# Patient Record
Sex: Female | Born: 1950
Health system: Southern US, Community
[De-identification: ages and names within clinical notes are randomized; demographics above are authoritative.]

## PROBLEM LIST (undated history)

## (undated) DIAGNOSIS — I10 Essential (primary) hypertension: Secondary | ICD-10-CM

## (undated) DIAGNOSIS — J309 Allergic rhinitis, unspecified: Secondary | ICD-10-CM

## (undated) DIAGNOSIS — E785 Hyperlipidemia, unspecified: Secondary | ICD-10-CM

## (undated) DIAGNOSIS — R3129 Other microscopic hematuria: Secondary | ICD-10-CM

## (undated) DIAGNOSIS — L02416 Cutaneous abscess of left lower limb: Secondary | ICD-10-CM

## (undated) DIAGNOSIS — M1990 Unspecified osteoarthritis, unspecified site: Secondary | ICD-10-CM

## (undated) HISTORY — PX: OTHER SURGICAL HISTORY: SHX169

## (undated) HISTORY — DX: Cutaneous abscess of left lower limb: L02.416

## (undated) HISTORY — PX: BLADDER ASPIRATION: SHX472

---

## 2004-05-08 ENCOUNTER — Ambulatory Visit: Payer: Self-pay

## 2005-05-23 ENCOUNTER — Ambulatory Visit: Payer: Self-pay

## 2005-06-04 ENCOUNTER — Ambulatory Visit: Payer: Self-pay

## 2005-08-27 ENCOUNTER — Ambulatory Visit: Payer: Self-pay | Admitting: Gastroenterology

## 2005-10-31 ENCOUNTER — Ambulatory Visit: Payer: Self-pay | Admitting: Internal Medicine

## 2005-12-18 ENCOUNTER — Ambulatory Visit: Payer: Self-pay | Admitting: Urology

## 2006-05-27 ENCOUNTER — Ambulatory Visit: Payer: Self-pay

## 2007-06-10 ENCOUNTER — Ambulatory Visit: Payer: Self-pay

## 2008-06-17 ENCOUNTER — Ambulatory Visit: Payer: Self-pay

## 2009-07-20 ENCOUNTER — Ambulatory Visit: Payer: Self-pay | Admitting: Internal Medicine

## 2010-11-13 ENCOUNTER — Ambulatory Visit: Payer: Self-pay | Admitting: Internal Medicine

## 2010-12-01 ENCOUNTER — Emergency Department: Payer: Self-pay | Admitting: Internal Medicine

## 2011-10-21 ENCOUNTER — Ambulatory Visit: Payer: Self-pay | Admitting: Gastroenterology

## 2012-01-06 ENCOUNTER — Ambulatory Visit: Payer: Self-pay | Admitting: Internal Medicine

## 2013-01-07 ENCOUNTER — Ambulatory Visit: Payer: Self-pay | Admitting: Internal Medicine

## 2014-01-14 ENCOUNTER — Ambulatory Visit: Payer: Self-pay | Admitting: Internal Medicine

## 2015-01-16 ENCOUNTER — Other Ambulatory Visit: Payer: Self-pay | Admitting: Internal Medicine

## 2015-01-16 DIAGNOSIS — Z1231 Encounter for screening mammogram for malignant neoplasm of breast: Secondary | ICD-10-CM

## 2015-01-20 ENCOUNTER — Ambulatory Visit
Admission: RE | Admit: 2015-01-20 | Discharge: 2015-01-20 | Disposition: A | Discharge status: Home / Self Care | Payer: No Typology Code available for payment source | Source: Ambulatory Visit | Attending: Internal Medicine | Admitting: Internal Medicine

## 2015-01-20 DIAGNOSIS — Z1231 Encounter for screening mammogram for malignant neoplasm of breast: Secondary | ICD-10-CM | POA: Diagnosis not present

## 2015-08-02 DIAGNOSIS — N183 Chronic kidney disease, stage 3 unspecified: Secondary | ICD-10-CM | POA: Insufficient documentation

## 2015-10-19 DIAGNOSIS — M810 Age-related osteoporosis without current pathological fracture: Secondary | ICD-10-CM | POA: Diagnosis not present

## 2015-11-16 DIAGNOSIS — D225 Melanocytic nevi of trunk: Secondary | ICD-10-CM | POA: Diagnosis not present

## 2015-11-16 DIAGNOSIS — C44712 Basal cell carcinoma of skin of right lower limb, including hip: Secondary | ICD-10-CM | POA: Diagnosis not present

## 2015-11-16 DIAGNOSIS — D2261 Melanocytic nevi of right upper limb, including shoulder: Secondary | ICD-10-CM | POA: Diagnosis not present

## 2015-11-16 DIAGNOSIS — L570 Actinic keratosis: Secondary | ICD-10-CM | POA: Diagnosis not present

## 2015-11-16 DIAGNOSIS — X32XXXA Exposure to sunlight, initial encounter: Secondary | ICD-10-CM | POA: Diagnosis not present

## 2015-11-16 DIAGNOSIS — D2272 Melanocytic nevi of left lower limb, including hip: Secondary | ICD-10-CM | POA: Diagnosis not present

## 2015-11-16 DIAGNOSIS — Z85828 Personal history of other malignant neoplasm of skin: Secondary | ICD-10-CM | POA: Diagnosis not present

## 2015-11-16 DIAGNOSIS — D485 Neoplasm of uncertain behavior of skin: Secondary | ICD-10-CM | POA: Diagnosis not present

## 2015-12-14 ENCOUNTER — Other Ambulatory Visit: Payer: Self-pay | Admitting: Internal Medicine

## 2015-12-14 DIAGNOSIS — Z1231 Encounter for screening mammogram for malignant neoplasm of breast: Secondary | ICD-10-CM

## 2016-01-12 DIAGNOSIS — C44712 Basal cell carcinoma of skin of right lower limb, including hip: Secondary | ICD-10-CM | POA: Diagnosis not present

## 2016-01-25 ENCOUNTER — Ambulatory Visit
Admission: RE | Admit: 2016-01-25 | Discharge: 2016-01-25 | Disposition: A | Discharge status: Home / Self Care | Payer: PPO | Source: Ambulatory Visit | Attending: Internal Medicine | Admitting: Internal Medicine

## 2016-01-25 ENCOUNTER — Other Ambulatory Visit: Payer: Self-pay | Admitting: Internal Medicine

## 2016-01-25 DIAGNOSIS — Z1231 Encounter for screening mammogram for malignant neoplasm of breast: Secondary | ICD-10-CM | POA: Diagnosis not present

## 2016-01-25 DIAGNOSIS — I10 Essential (primary) hypertension: Secondary | ICD-10-CM | POA: Diagnosis not present

## 2016-01-25 DIAGNOSIS — R3129 Other microscopic hematuria: Secondary | ICD-10-CM | POA: Diagnosis not present

## 2016-01-25 DIAGNOSIS — M810 Age-related osteoporosis without current pathological fracture: Secondary | ICD-10-CM | POA: Diagnosis not present

## 2016-02-01 DIAGNOSIS — N183 Chronic kidney disease, stage 3 (moderate): Secondary | ICD-10-CM | POA: Diagnosis not present

## 2016-02-01 DIAGNOSIS — M810 Age-related osteoporosis without current pathological fracture: Secondary | ICD-10-CM | POA: Diagnosis not present

## 2016-02-01 DIAGNOSIS — I10 Essential (primary) hypertension: Secondary | ICD-10-CM | POA: Diagnosis not present

## 2016-02-01 DIAGNOSIS — R3129 Other microscopic hematuria: Secondary | ICD-10-CM | POA: Diagnosis not present

## 2016-02-01 DIAGNOSIS — E7800 Pure hypercholesterolemia, unspecified: Secondary | ICD-10-CM | POA: Diagnosis not present

## 2016-05-14 DIAGNOSIS — H25813 Combined forms of age-related cataract, bilateral: Secondary | ICD-10-CM | POA: Diagnosis not present

## 2016-05-16 DIAGNOSIS — D2261 Melanocytic nevi of right upper limb, including shoulder: Secondary | ICD-10-CM | POA: Diagnosis not present

## 2016-05-16 DIAGNOSIS — Z85828 Personal history of other malignant neoplasm of skin: Secondary | ICD-10-CM | POA: Diagnosis not present

## 2016-05-16 DIAGNOSIS — D3612 Benign neoplasm of peripheral nerves and autonomic nervous system, upper limb, including shoulder: Secondary | ICD-10-CM | POA: Diagnosis not present

## 2016-05-16 DIAGNOSIS — D485 Neoplasm of uncertain behavior of skin: Secondary | ICD-10-CM | POA: Diagnosis not present

## 2016-05-16 DIAGNOSIS — C44719 Basal cell carcinoma of skin of left lower limb, including hip: Secondary | ICD-10-CM | POA: Diagnosis not present

## 2016-05-16 DIAGNOSIS — D2271 Melanocytic nevi of right lower limb, including hip: Secondary | ICD-10-CM | POA: Diagnosis not present

## 2016-06-14 DIAGNOSIS — C44719 Basal cell carcinoma of skin of left lower limb, including hip: Secondary | ICD-10-CM | POA: Diagnosis not present

## 2016-07-26 DIAGNOSIS — N183 Chronic kidney disease, stage 3 (moderate): Secondary | ICD-10-CM | POA: Diagnosis not present

## 2016-07-26 DIAGNOSIS — R3129 Other microscopic hematuria: Secondary | ICD-10-CM | POA: Diagnosis not present

## 2016-07-26 DIAGNOSIS — I10 Essential (primary) hypertension: Secondary | ICD-10-CM | POA: Diagnosis not present

## 2016-08-02 DIAGNOSIS — Z0000 Encounter for general adult medical examination without abnormal findings: Secondary | ICD-10-CM | POA: Diagnosis not present

## 2016-08-02 DIAGNOSIS — Z1211 Encounter for screening for malignant neoplasm of colon: Secondary | ICD-10-CM | POA: Diagnosis not present

## 2016-08-02 DIAGNOSIS — I10 Essential (primary) hypertension: Secondary | ICD-10-CM | POA: Diagnosis not present

## 2016-08-02 DIAGNOSIS — D751 Secondary polycythemia: Secondary | ICD-10-CM | POA: Diagnosis not present

## 2016-08-02 DIAGNOSIS — E7800 Pure hypercholesterolemia, unspecified: Secondary | ICD-10-CM | POA: Diagnosis not present

## 2016-08-02 DIAGNOSIS — N183 Chronic kidney disease, stage 3 (moderate): Secondary | ICD-10-CM | POA: Diagnosis not present

## 2016-08-02 DIAGNOSIS — R3129 Other microscopic hematuria: Secondary | ICD-10-CM | POA: Diagnosis not present

## 2016-08-02 DIAGNOSIS — M810 Age-related osteoporosis without current pathological fracture: Secondary | ICD-10-CM | POA: Diagnosis not present

## 2016-08-02 DIAGNOSIS — Z1212 Encounter for screening for malignant neoplasm of rectum: Secondary | ICD-10-CM | POA: Diagnosis not present

## 2016-09-12 DIAGNOSIS — Z86010 Personal history of colonic polyps: Secondary | ICD-10-CM | POA: Diagnosis not present

## 2016-12-12 ENCOUNTER — Encounter: Payer: Self-pay | Admitting: *Deleted

## 2016-12-13 ENCOUNTER — Ambulatory Visit: Payer: PPO | Admitting: Certified Registered"

## 2016-12-13 ENCOUNTER — Ambulatory Visit
Admission: RE | Admit: 2016-12-13 | Discharge: 2016-12-13 | Disposition: A | Discharge status: Home / Self Care | Payer: PPO | Source: Ambulatory Visit | Attending: Unknown Physician Specialty | Admitting: Unknown Physician Specialty

## 2016-12-13 ENCOUNTER — Encounter
Admission: RE | Disposition: A | Discharge status: Home / Self Care | Payer: Self-pay | Source: Ambulatory Visit | Attending: Unknown Physician Specialty

## 2016-12-13 ENCOUNTER — Encounter: Payer: Self-pay | Admitting: *Deleted

## 2016-12-13 DIAGNOSIS — Z1211 Encounter for screening for malignant neoplasm of colon: Secondary | ICD-10-CM | POA: Diagnosis not present

## 2016-12-13 DIAGNOSIS — D122 Benign neoplasm of ascending colon: Secondary | ICD-10-CM | POA: Diagnosis not present

## 2016-12-13 DIAGNOSIS — M1990 Unspecified osteoarthritis, unspecified site: Secondary | ICD-10-CM | POA: Insufficient documentation

## 2016-12-13 DIAGNOSIS — K640 First degree hemorrhoids: Secondary | ICD-10-CM | POA: Diagnosis not present

## 2016-12-13 DIAGNOSIS — I10 Essential (primary) hypertension: Secondary | ICD-10-CM | POA: Diagnosis not present

## 2016-12-13 DIAGNOSIS — Z79899 Other long term (current) drug therapy: Secondary | ICD-10-CM | POA: Diagnosis not present

## 2016-12-13 DIAGNOSIS — E785 Hyperlipidemia, unspecified: Secondary | ICD-10-CM | POA: Insufficient documentation

## 2016-12-13 DIAGNOSIS — Z86010 Personal history of colonic polyps: Secondary | ICD-10-CM | POA: Insufficient documentation

## 2016-12-13 DIAGNOSIS — K635 Polyp of colon: Secondary | ICD-10-CM | POA: Diagnosis not present

## 2016-12-13 DIAGNOSIS — K5790 Diverticulosis of intestine, part unspecified, without perforation or abscess without bleeding: Secondary | ICD-10-CM | POA: Diagnosis not present

## 2016-12-13 HISTORY — DX: Essential (primary) hypertension: I10

## 2016-12-13 HISTORY — DX: Other microscopic hematuria: R31.29

## 2016-12-13 HISTORY — DX: Unspecified osteoarthritis, unspecified site: M19.90

## 2016-12-13 HISTORY — DX: Hyperlipidemia, unspecified: E78.5

## 2016-12-13 HISTORY — DX: Allergic rhinitis, unspecified: J30.9

## 2016-12-13 HISTORY — PX: COLONOSCOPY WITH PROPOFOL: SHX5780

## 2016-12-13 SURGERY — COLONOSCOPY WITH PROPOFOL
Anesthesia: General

## 2016-12-13 MED ORDER — PROPOFOL 500 MG/50ML IV EMUL
INTRAVENOUS | Status: DC | PRN
  Administered 2016-12-13: 120 ug/kg/min via INTRAVENOUS

## 2016-12-13 MED ORDER — MIDAZOLAM HCL 5 MG/5ML IJ SOLN
INTRAMUSCULAR | Status: DC | PRN
  Administered 2016-12-13: 2 mg via INTRAVENOUS

## 2016-12-13 MED ORDER — SODIUM CHLORIDE 0.9 % IV SOLN
INTRAVENOUS | Status: DC
  Administered 2016-12-13: 1000 mL via INTRAVENOUS

## 2016-12-13 MED ORDER — SODIUM CHLORIDE 0.9 % IV SOLN: INTRAVENOUS | Status: DC

## 2016-12-13 MED ORDER — MIDAZOLAM HCL 2 MG/2ML IJ SOLN
INTRAMUSCULAR | Status: AC
  Filled 2016-12-13: qty 2

## 2016-12-13 MED ORDER — PROPOFOL 10 MG/ML IV BOLUS
INTRAVENOUS | Status: DC | PRN
  Administered 2016-12-13: 70 mg via INTRAVENOUS
  Administered 2016-12-13: 30 mg via INTRAVENOUS

## 2016-12-13 NOTE — Anesthesia Post-op Follow-up Note (Cosign Needed)
Anesthesia QCDR form completed.        

## 2016-12-13 NOTE — Anesthesia Postprocedure Evaluation (Signed)
Anesthesia Post Note  Patient: GAGANDEEP KOSSMAN  Procedure(s) Performed: Procedure(s) (LRB): COLONOSCOPY WITH PROPOFOL (N/A)  Patient location during evaluation: PACU Anesthesia Type: General Level of consciousness: awake Pain management: pain level controlled Vital Signs Assessment: post-procedure vital signs reviewed and stable Respiratory status: spontaneous breathing Cardiovascular status: stable Anesthetic complications: no     Last Vitals:  Vitals:   12/13/16 1120 12/13/16 1130  BP: (!) 132/100 (!) 130/98  Pulse: 69 64  Resp: 14 19  Temp:      Last Pain:  Vitals:   12/13/16 1100  TempSrc: Tympanic                 VAN STAVEREN,Eliceo Gladu

## 2016-12-13 NOTE — Transfer of Care (Signed)
Immediate Anesthesia Transfer of Care Note  Patient: Tracey Bradshaw  Procedure(s) Performed: Procedure(s): COLONOSCOPY WITH PROPOFOL (N/A)  Patient Location: Endoscopy Unit  Anesthesia Type:General  Level of Consciousness: awake and alert   Airway & Oxygen Therapy: Patient Spontanous Breathing and Patient connected to nasal cannula oxygen  Post-op Assessment: Report given to RN and Post -op Vital signs reviewed and stable  Post vital signs: Reviewed  Last Vitals:  Vitals:   12/13/16 0932 12/13/16 1100  BP: (!) 154/96 99/71  Pulse: 98 72  Resp: 16 15  Temp: 36.4 C 36.2 C    Last Pain:  Vitals:   12/13/16 0932  TempSrc: Tympanic         Complications: No apparent anesthesia complications

## 2016-12-13 NOTE — Op Note (Signed)
[facility] Gastroenterology Patient Name: Tracey Bradshaw Procedure Date: 12/13/2016 10:22 AM MRN: [id number] Account #: [id number] Date of Birth: 1951/02/26 Admit Type: Outpatient Age: 66 Room: [facility] ENDO ROOM 1 Gender: Female Note Status: Finalized Procedure:            Colonoscopy Indications:          High risk colon cancer surveillance: Personal history                        of colonic polyps Providers:            Manya Silvas, MD Referring MD:         Ramonita Lab, MD (Referring MD) Medicines:            Propofol per Anesthesia Complications:        No immediate complications. Procedure:            Pre-Anesthesia Assessment:                       - After reviewing the risks and benefits, the patient                        was deemed in satisfactory condition to undergo the                        procedure.                       After obtaining informed consent, the colonoscope was                        passed under direct vision. Throughout the procedure,                        the patient's blood pressure, pulse, and oxygen                        saturations were monitored continuously. The                        Colonoscope was introduced through the anus and                        advanced to the the cecum, identified by appendiceal                        orifice and ileocecal valve. The colonoscopy was                        somewhat difficult due to restricted mobility of the                        colon. Successful completion of the procedure was aided                        by applying abdominal pressure. The patient tolerated                        the procedure well. The quality of the bowel  preparation was excellent. Findings:      A small polyp was found in the ascending colon. The polyp was sessile.       The polyp was removed with a hot snare. Resection and retrieval were       complete.      Internal hemorrhoids  were found during endoscopy. The hemorrhoids were       small and Grade I (internal hemorrhoids that do not prolapse).      The exam was otherwise without abnormality. Impression:           - One small polyp in the ascending colon, removed with                        a hot snare. Resected and retrieved.                       - Internal hemorrhoids.                       - The examination was otherwise normal. Recommendation:       - Await pathology results. Probably repeat in 5 years. Manya Silvas, MD 12/13/2016 11:02:15 AM This report has been signed electronically. Number of Addenda: 0 Note Initiated On: 12/13/2016 10:22 AM Scope Withdrawal Time: 0 hours 13 minutes 24 seconds  Total Procedure Duration: 0 hours 27 minutes 32 seconds       [facility]

## 2016-12-13 NOTE — H&P (Signed)
   Primary Care Physician:  Adin Hector, MD Primary Gastroenterologist:  Dr. Vira Agar  Pre-Procedure History & Physical: HPI:  Tracey Bradshaw is a 66 y.o. female is here for an colonoscopy.   Past Medical History:  Diagnosis Date  . Arthritis   . Hematuria, microscopic   . Hyperlipidemia   . Hypertension   . Rhinitis, allergic     Past Surgical History:  Procedure Laterality Date  . BLADDER ASPIRATION     benign  . colonoscopies      Prior to Admission medications   Medication Sig Start Date End Date Taking? Authorizing Provider  alendronate (FOSAMAX) 70 MG tablet Take 70 mg by mouth once a week. Take with a full glass of water on an empty stomach.   Yes [provider]  calcium carbonate (OSCAL) 1500 (600 Ca) MG TABS tablet Take 1,500 mg by mouth 2 (two) times daily with a meal.   Yes [provider]  cholecalciferol (VITAMIN D) 400 units TABS tablet Take 1,000 Units by mouth.   Yes [provider]  loratadine (CLARITIN) 10 MG tablet Take 10 mg by mouth daily.   Yes [provider]  lovastatin (MEVACOR) 40 MG tablet Take 40 mg by mouth at bedtime.   Yes [provider]  Multiple Vitamin (MULTIVITAMIN) tablet Take 1 tablet by mouth daily.   Yes [provider]  triamterene-hydrochlorothiazide (MAXZIDE-25) 37.5-25 MG tablet Take 1 tablet by mouth daily.   Yes [provider]    Allergies as of 09/17/2016  . (Not on File)    Family History  Problem Relation Age of Onset  . Breast cancer Neg Hx     Social History   Social History  . Marital status: Married    Spouse name: N/A  . Number of children: N/A  . Years of education: N/A   Occupational History  . Not on file.   Social History Main Topics  . Smoking status: Never Smoker  . Smokeless tobacco: Never Used  . Alcohol use Not on file  . Drug use: Unknown  . Sexual activity: Not on file   Other Topics Concern  . Not on file   Social  History Narrative  . No narrative on file    Review of Systems: See HPI, otherwise negative ROS  Physical Exam: BP (!) 154/96   Pulse 98   Temp 97.6 F (36.4 C) (Tympanic)   Resp 16   Ht 5' 5.5" (1.664 m)   Wt 59.4 kg (131 lb)   SpO2 98%   BMI 21.47 kg/m  General:   Alert,  pleasant and cooperative in NAD Head:  Normocephalic and atraumatic. Neck:  Supple; no masses or thyromegaly. Lungs:  Clear throughout to auscultation.    Heart:  Regular rate and rhythm. Abdomen:  Soft, nontender and nondistended. Normal bowel sounds, without guarding, and without rebound.   Neurologic:  Alert and  oriented x4;  grossly normal neurologically.  Impression/Plan: Tracey Bradshaw is here for an colonoscopy to be performed for [facility] colon polyps  Risks, benefits, limitations, and alternatives regarding  colonoscopy have been reviewed with the patient.  Questions have been answered.  All parties agreeable.   Gaylyn Cheers, MD  12/13/2016, 10:26 AM

## 2016-12-13 NOTE — Anesthesia Preprocedure Evaluation (Signed)
Anesthesia Evaluation  Patient identified by MRN, date of birth, ID band Patient awake    Reviewed: Allergy & Precautions, NPO status , Patient's Chart, lab work & pertinent test results  Airway Mallampati: III       Dental  (+) Teeth Intact   Pulmonary neg pulmonary ROS,    Pulmonary exam normal        Cardiovascular Exercise Tolerance: Good hypertension, Pt. on medications  Rhythm:Regular Rate:Normal     Neuro/Psych negative neurological ROS     GI/Hepatic negative GI ROS, Neg liver ROS,   Endo/Other  negative endocrine ROS  Renal/GU negative Renal ROS     Musculoskeletal   Abdominal Normal abdominal exam  (+)   Peds negative pediatric ROS (+)  Hematology negative hematology ROS (+)   Anesthesia Other Findings   Reproductive/Obstetrics                             Anesthesia Physical Anesthesia Plan  ASA: II  Anesthesia Plan: General   Post-op Pain Management:    Induction: Intravenous  PONV Risk Score and Plan: 1 and Ondansetron and Treatment may vary due to age  Airway Management Planned: Natural Airway  Additional Equipment:   Intra-op Plan:   Post-operative Plan:   Informed Consent: I have reviewed the patients History and Physical, chart, labs and discussed the procedure including the risks, benefits and alternatives for the proposed anesthesia with the patient or authorized representative who has indicated his/her understanding and acceptance.     Plan Discussed with: CRNA  Anesthesia Plan Comments:         Anesthesia Quick Evaluation

## 2016-12-16 ENCOUNTER — Encounter: Payer: Self-pay | Admitting: Unknown Physician Specialty

## 2016-12-16 LAB — SURGICAL PATHOLOGY

## 2016-12-26 ENCOUNTER — Other Ambulatory Visit: Payer: Self-pay | Admitting: Internal Medicine

## 2016-12-26 DIAGNOSIS — Z1231 Encounter for screening mammogram for malignant neoplasm of breast: Secondary | ICD-10-CM

## 2017-01-21 DIAGNOSIS — D2261 Melanocytic nevi of right upper limb, including shoulder: Secondary | ICD-10-CM | POA: Diagnosis not present

## 2017-01-21 DIAGNOSIS — D225 Melanocytic nevi of trunk: Secondary | ICD-10-CM | POA: Diagnosis not present

## 2017-01-21 DIAGNOSIS — D3612 Benign neoplasm of peripheral nerves and autonomic nervous system, upper limb, including shoulder: Secondary | ICD-10-CM | POA: Diagnosis not present

## 2017-01-21 DIAGNOSIS — Z85828 Personal history of other malignant neoplasm of skin: Secondary | ICD-10-CM | POA: Diagnosis not present

## 2017-01-24 DIAGNOSIS — I10 Essential (primary) hypertension: Secondary | ICD-10-CM | POA: Diagnosis not present

## 2017-01-24 DIAGNOSIS — D751 Secondary polycythemia: Secondary | ICD-10-CM | POA: Diagnosis not present

## 2017-01-30 ENCOUNTER — Ambulatory Visit
Admission: RE | Admit: 2017-01-30 | Discharge: 2017-01-30 | Disposition: A | Discharge status: Home / Self Care | Payer: PPO | Source: Ambulatory Visit | Attending: Internal Medicine | Admitting: Internal Medicine

## 2017-01-30 DIAGNOSIS — Z1231 Encounter for screening mammogram for malignant neoplasm of breast: Secondary | ICD-10-CM | POA: Insufficient documentation

## 2017-01-31 DIAGNOSIS — R3129 Other microscopic hematuria: Secondary | ICD-10-CM | POA: Diagnosis not present

## 2017-01-31 DIAGNOSIS — E7800 Pure hypercholesterolemia, unspecified: Secondary | ICD-10-CM | POA: Diagnosis not present

## 2017-01-31 DIAGNOSIS — N183 Chronic kidney disease, stage 3 (moderate): Secondary | ICD-10-CM | POA: Diagnosis not present

## 2017-01-31 DIAGNOSIS — M1990 Unspecified osteoarthritis, unspecified site: Secondary | ICD-10-CM | POA: Diagnosis not present

## 2017-01-31 DIAGNOSIS — M810 Age-related osteoporosis without current pathological fracture: Secondary | ICD-10-CM | POA: Diagnosis not present

## 2017-01-31 DIAGNOSIS — I10 Essential (primary) hypertension: Secondary | ICD-10-CM | POA: Diagnosis not present

## 2017-08-01 DIAGNOSIS — I10 Essential (primary) hypertension: Secondary | ICD-10-CM | POA: Diagnosis not present

## 2017-08-01 DIAGNOSIS — N183 Chronic kidney disease, stage 3 (moderate): Secondary | ICD-10-CM | POA: Diagnosis not present

## 2017-08-01 DIAGNOSIS — E7800 Pure hypercholesterolemia, unspecified: Secondary | ICD-10-CM | POA: Diagnosis not present

## 2017-08-01 DIAGNOSIS — R3129 Other microscopic hematuria: Secondary | ICD-10-CM | POA: Diagnosis not present

## 2017-08-01 DIAGNOSIS — M810 Age-related osteoporosis without current pathological fracture: Secondary | ICD-10-CM | POA: Diagnosis not present

## 2017-08-08 DIAGNOSIS — N183 Chronic kidney disease, stage 3 (moderate): Secondary | ICD-10-CM | POA: Diagnosis not present

## 2017-08-08 DIAGNOSIS — R3129 Other microscopic hematuria: Secondary | ICD-10-CM | POA: Diagnosis not present

## 2017-08-08 DIAGNOSIS — Z23 Encounter for immunization: Secondary | ICD-10-CM | POA: Diagnosis not present

## 2017-08-08 DIAGNOSIS — E7800 Pure hypercholesterolemia, unspecified: Secondary | ICD-10-CM | POA: Diagnosis not present

## 2017-08-08 DIAGNOSIS — Z124 Encounter for screening for malignant neoplasm of cervix: Secondary | ICD-10-CM | POA: Diagnosis not present

## 2017-08-08 DIAGNOSIS — Z0000 Encounter for general adult medical examination without abnormal findings: Secondary | ICD-10-CM | POA: Diagnosis not present

## 2017-08-08 DIAGNOSIS — I10 Essential (primary) hypertension: Secondary | ICD-10-CM | POA: Diagnosis not present

## 2017-08-08 DIAGNOSIS — M810 Age-related osteoporosis without current pathological fracture: Secondary | ICD-10-CM | POA: Diagnosis not present

## 2018-01-02 ENCOUNTER — Other Ambulatory Visit: Payer: Self-pay | Admitting: Internal Medicine

## 2018-01-02 DIAGNOSIS — Z1231 Encounter for screening mammogram for malignant neoplasm of breast: Secondary | ICD-10-CM

## 2018-01-20 DIAGNOSIS — Z08 Encounter for follow-up examination after completed treatment for malignant neoplasm: Secondary | ICD-10-CM | POA: Diagnosis not present

## 2018-01-20 DIAGNOSIS — D2271 Melanocytic nevi of right lower limb, including hip: Secondary | ICD-10-CM | POA: Diagnosis not present

## 2018-01-20 DIAGNOSIS — D485 Neoplasm of uncertain behavior of skin: Secondary | ICD-10-CM | POA: Diagnosis not present

## 2018-01-20 DIAGNOSIS — X32XXXA Exposure to sunlight, initial encounter: Secondary | ICD-10-CM | POA: Diagnosis not present

## 2018-01-20 DIAGNOSIS — D2272 Melanocytic nevi of left lower limb, including hip: Secondary | ICD-10-CM | POA: Diagnosis not present

## 2018-01-20 DIAGNOSIS — D2261 Melanocytic nevi of right upper limb, including shoulder: Secondary | ICD-10-CM | POA: Diagnosis not present

## 2018-01-20 DIAGNOSIS — D225 Melanocytic nevi of trunk: Secondary | ICD-10-CM | POA: Diagnosis not present

## 2018-01-20 DIAGNOSIS — Z85828 Personal history of other malignant neoplasm of skin: Secondary | ICD-10-CM | POA: Diagnosis not present

## 2018-01-20 DIAGNOSIS — D2262 Melanocytic nevi of left upper limb, including shoulder: Secondary | ICD-10-CM | POA: Diagnosis not present

## 2018-01-20 DIAGNOSIS — L570 Actinic keratosis: Secondary | ICD-10-CM | POA: Diagnosis not present

## 2018-01-30 DIAGNOSIS — I10 Essential (primary) hypertension: Secondary | ICD-10-CM | POA: Diagnosis not present

## 2018-01-30 DIAGNOSIS — R3129 Other microscopic hematuria: Secondary | ICD-10-CM | POA: Diagnosis not present

## 2018-01-30 DIAGNOSIS — E7800 Pure hypercholesterolemia, unspecified: Secondary | ICD-10-CM | POA: Diagnosis not present

## 2018-02-02 DIAGNOSIS — H25813 Combined forms of age-related cataract, bilateral: Secondary | ICD-10-CM | POA: Diagnosis not present

## 2018-02-06 ENCOUNTER — Ambulatory Visit
Admission: RE | Admit: 2018-02-06 | Discharge: 2018-02-06 | Disposition: A | Discharge status: Home / Self Care | Payer: PPO | Source: Ambulatory Visit | Attending: Internal Medicine | Admitting: Internal Medicine

## 2018-02-06 DIAGNOSIS — Z1231 Encounter for screening mammogram for malignant neoplasm of breast: Secondary | ICD-10-CM | POA: Insufficient documentation

## 2018-02-09 DIAGNOSIS — X32XXXA Exposure to sunlight, initial encounter: Secondary | ICD-10-CM | POA: Diagnosis not present

## 2018-02-09 DIAGNOSIS — L570 Actinic keratosis: Secondary | ICD-10-CM | POA: Diagnosis not present

## 2018-02-11 DIAGNOSIS — R739 Hyperglycemia, unspecified: Secondary | ICD-10-CM | POA: Diagnosis not present

## 2018-02-11 DIAGNOSIS — I10 Essential (primary) hypertension: Secondary | ICD-10-CM | POA: Diagnosis not present

## 2018-02-11 DIAGNOSIS — M810 Age-related osteoporosis without current pathological fracture: Secondary | ICD-10-CM | POA: Diagnosis not present

## 2018-02-11 DIAGNOSIS — E7800 Pure hypercholesterolemia, unspecified: Secondary | ICD-10-CM | POA: Diagnosis not present

## 2018-02-11 DIAGNOSIS — N183 Chronic kidney disease, stage 3 (moderate): Secondary | ICD-10-CM | POA: Diagnosis not present

## 2018-02-11 DIAGNOSIS — R3129 Other microscopic hematuria: Secondary | ICD-10-CM | POA: Diagnosis not present

## 2018-02-27 DIAGNOSIS — M419 Scoliosis, unspecified: Secondary | ICD-10-CM | POA: Diagnosis not present

## 2018-02-27 DIAGNOSIS — M5136 Other intervertebral disc degeneration, lumbar region: Secondary | ICD-10-CM | POA: Diagnosis not present

## 2018-02-27 DIAGNOSIS — M47816 Spondylosis without myelopathy or radiculopathy, lumbar region: Secondary | ICD-10-CM | POA: Diagnosis not present

## 2018-03-12 DIAGNOSIS — R739 Hyperglycemia, unspecified: Secondary | ICD-10-CM | POA: Diagnosis not present

## 2018-08-12 DIAGNOSIS — N183 Chronic kidney disease, stage 3 (moderate): Secondary | ICD-10-CM | POA: Diagnosis not present

## 2018-08-12 DIAGNOSIS — M810 Age-related osteoporosis without current pathological fracture: Secondary | ICD-10-CM | POA: Diagnosis not present

## 2018-08-12 DIAGNOSIS — I10 Essential (primary) hypertension: Secondary | ICD-10-CM | POA: Diagnosis not present

## 2018-08-12 DIAGNOSIS — E7800 Pure hypercholesterolemia, unspecified: Secondary | ICD-10-CM | POA: Diagnosis not present

## 2018-08-12 DIAGNOSIS — R3129 Other microscopic hematuria: Secondary | ICD-10-CM | POA: Diagnosis not present

## 2018-08-19 DIAGNOSIS — D751 Secondary polycythemia: Secondary | ICD-10-CM | POA: Diagnosis not present

## 2018-08-19 DIAGNOSIS — E7800 Pure hypercholesterolemia, unspecified: Secondary | ICD-10-CM | POA: Diagnosis not present

## 2018-08-19 DIAGNOSIS — N183 Chronic kidney disease, stage 3 (moderate): Secondary | ICD-10-CM | POA: Diagnosis not present

## 2018-08-19 DIAGNOSIS — R739 Hyperglycemia, unspecified: Secondary | ICD-10-CM | POA: Diagnosis not present

## 2018-08-19 DIAGNOSIS — R3129 Other microscopic hematuria: Secondary | ICD-10-CM | POA: Diagnosis not present

## 2018-08-19 DIAGNOSIS — M810 Age-related osteoporosis without current pathological fracture: Secondary | ICD-10-CM | POA: Diagnosis not present

## 2018-08-19 DIAGNOSIS — Z0000 Encounter for general adult medical examination without abnormal findings: Secondary | ICD-10-CM | POA: Diagnosis not present

## 2018-08-19 DIAGNOSIS — I10 Essential (primary) hypertension: Secondary | ICD-10-CM | POA: Diagnosis not present

## 2018-08-19 DIAGNOSIS — R05 Cough: Secondary | ICD-10-CM | POA: Diagnosis not present

## 2018-09-02 DIAGNOSIS — I10 Essential (primary) hypertension: Secondary | ICD-10-CM | POA: Diagnosis not present

## 2018-09-02 DIAGNOSIS — R3129 Other microscopic hematuria: Secondary | ICD-10-CM | POA: Diagnosis not present

## 2018-09-02 DIAGNOSIS — D751 Secondary polycythemia: Secondary | ICD-10-CM | POA: Diagnosis not present

## 2018-10-22 DIAGNOSIS — I10 Essential (primary) hypertension: Secondary | ICD-10-CM | POA: Diagnosis not present

## 2018-10-22 DIAGNOSIS — N183 Chronic kidney disease, stage 3 (moderate): Secondary | ICD-10-CM | POA: Diagnosis not present

## 2018-10-22 DIAGNOSIS — J069 Acute upper respiratory infection, unspecified: Secondary | ICD-10-CM | POA: Diagnosis not present

## 2018-10-31 ENCOUNTER — Emergency Department: Payer: PPO

## 2018-10-31 ENCOUNTER — Other Ambulatory Visit: Payer: Self-pay

## 2018-10-31 ENCOUNTER — Emergency Department
Admission: EM | Admit: 2018-10-31 | Discharge: 2018-10-31 | Disposition: A | Discharge status: Home / Self Care | Payer: PPO | Attending: Emergency Medicine | Admitting: Emergency Medicine

## 2018-10-31 DIAGNOSIS — I10 Essential (primary) hypertension: Secondary | ICD-10-CM | POA: Diagnosis not present

## 2018-10-31 DIAGNOSIS — L03116 Cellulitis of left lower limb: Secondary | ICD-10-CM | POA: Diagnosis not present

## 2018-10-31 DIAGNOSIS — S81802A Unspecified open wound, left lower leg, initial encounter: Secondary | ICD-10-CM | POA: Diagnosis not present

## 2018-10-31 DIAGNOSIS — Z79899 Other long term (current) drug therapy: Secondary | ICD-10-CM | POA: Insufficient documentation

## 2018-10-31 DIAGNOSIS — L0291 Cutaneous abscess, unspecified: Secondary | ICD-10-CM

## 2018-10-31 DIAGNOSIS — L02416 Cutaneous abscess of left lower limb: Secondary | ICD-10-CM | POA: Insufficient documentation

## 2018-10-31 DIAGNOSIS — L089 Local infection of the skin and subcutaneous tissue, unspecified: Secondary | ICD-10-CM | POA: Diagnosis present

## 2018-10-31 LAB — CBC WITH DIFFERENTIAL/PLATELET
Abs Immature Granulocytes: 0.11 10*3/uL — ABNORMAL HIGH (ref 0.00–0.07)
Basophils Absolute: 0 10*3/uL (ref 0.0–0.1)
Basophils Relative: 0 %
Eosinophils Absolute: 0.1 10*3/uL (ref 0.0–0.5)
Eosinophils Relative: 0 %
HCT: 46.2 % — ABNORMAL HIGH (ref 36.0–46.0)
Hemoglobin: 15.6 g/dL — ABNORMAL HIGH (ref 12.0–15.0)
Immature Granulocytes: 1 %
Lymphocytes Relative: 10 %
Lymphs Abs: 1.7 10*3/uL (ref 0.7–4.0)
MCH: 32.8 pg (ref 26.0–34.0)
MCHC: 33.8 g/dL (ref 30.0–36.0)
MCV: 97.1 fL (ref 80.0–100.0)
Monocytes Absolute: 1.2 10*3/uL — ABNORMAL HIGH (ref 0.1–1.0)
Monocytes Relative: 7 %
Neutro Abs: 13.7 10*3/uL — ABNORMAL HIGH (ref 1.7–7.7)
Neutrophils Relative %: 82 %
Platelets: 306 10*3/uL (ref 150–400)
RBC: 4.76 MIL/uL (ref 3.87–5.11)
RDW: 13.2 % (ref 11.5–15.5)
WBC: 16.7 10*3/uL — ABNORMAL HIGH (ref 4.0–10.5)
nRBC: 0 % (ref 0.0–0.2)

## 2018-10-31 LAB — BASIC METABOLIC PANEL
Anion gap: 11 (ref 5–15)
BUN: 19 mg/dL (ref 8–23)
CO2: 28 mmol/L (ref 22–32)
Calcium: 9.3 mg/dL (ref 8.9–10.3)
Chloride: 99 mmol/L (ref 98–111)
Creatinine, Ser: 0.88 mg/dL (ref 0.44–1.00)
GFR calc Af Amer: >60 mL/min (ref >60)
GFR calc non Af Amer: >60 mL/min (ref >60)
Glucose, Bld: 110 mg/dL — ABNORMAL HIGH (ref 70–99)
Potassium: 3.3 mmol/L — ABNORMAL LOW (ref 3.5–5.1)
Sodium: 138 mmol/L (ref 135–145)

## 2018-10-31 LAB — LACTIC ACID, PLASMA: Lactic Acid, Venous: 1.3 mmol/L (ref 0.5–1.9)

## 2018-10-31 MED ORDER — VANCOMYCIN HCL 10 G IV SOLR
1250.0000 mg | Freq: Once | INTRAVENOUS | Status: AC
  Administered 2018-10-31: 1250 mg via INTRAVENOUS
  Filled 2018-10-31: qty 1250

## 2018-10-31 MED ORDER — VANCOMYCIN HCL IN DEXTROSE 1-5 GM/200ML-% IV SOLN
1000.0000 mg | INTRAVENOUS | Status: DC
  Administered 2018-10-31: 1000 mg via INTRAVENOUS

## 2018-10-31 MED ORDER — SULFAMETHOXAZOLE-TRIMETHOPRIM 800-160 MG PO TABS: 2.0000 | ORAL_TABLET | Freq: Two times a day (BID) | ORAL | 0 refills | Status: DC

## 2018-10-31 MED ORDER — SODIUM CHLORIDE 0.9 % IV BOLUS
500.0000 mL | Freq: Once | INTRAVENOUS | Status: AC
  Administered 2018-10-31: 500 mL via INTRAVENOUS

## 2018-10-31 MED ORDER — VANCOMYCIN HCL IN DEXTROSE 1-5 GM/200ML-% IV SOLN
1000.0000 mg | Freq: Once | INTRAVENOUS | Status: DC
  Filled 2018-10-31: qty 200

## 2018-10-31 MED ORDER — SODIUM CHLORIDE 0.9 % IV SOLN
2.0000 g | Freq: Once | INTRAVENOUS | Status: AC
  Administered 2018-10-31: 2 g via INTRAVENOUS
  Filled 2018-10-31: qty 20

## 2018-10-31 MED ORDER — CEPHALEXIN 500 MG PO CAPS: 500.0000 mg | ORAL_CAPSULE | Freq: Four times a day (QID) | ORAL | 0 refills | Status: DC

## 2018-10-31 MED ORDER — LIDOCAINE HCL 2 % IJ SOLN
10.0000 mL | Freq: Once | INTRAMUSCULAR | Status: AC
  Administered 2018-10-31: 200 mg via INTRADERMAL
  Filled 2018-10-31: qty 10

## 2018-10-31 MED ORDER — LIDOCAINE HCL (PF) 1 % IJ SOLN
INTRAMUSCULAR | Status: AC
  Filled 2018-10-31: qty 10

## 2018-10-31 NOTE — ED Notes (Signed)
Wound culture sent to lab.

## 2018-10-31 NOTE — ED Provider Notes (Signed)
[facility] Emergency Department Provider Note ____________________________________________   First MD Initiated Contact with Patient 10/31/18 1044     (approximate)  I have reviewed the triage vital signs and the nursing notes.   HISTORY  Chief Complaint Abscess  HPI Tracey Bradshaw is a 68 y.o. female presents for evaluation for an infection or abscess behind her left leg  Patient reports a few days ago she started with a pimple over the back of the left leg but it is grown in size.   Is she went to urgent care and they recommended she come to the ER for further evaluation.  She denies fevers or chills.  No nausea vomiting.  Its progressed in size but has not really drained.  No nausea no vomiting.  No chills or muscle aches.  She has not noticed any fever at all and has been feeling okay eating and drinking well but thinks that she probably needs some type of drainage from this abscess  Past Medical History:  Diagnosis Date  . Arthritis   . Hematuria, microscopic   . Hyperlipidemia   . Hypertension   . Rhinitis, allergic     There are no active problems to display for this patient.   Past Surgical History:  Procedure Laterality Date  . BLADDER ASPIRATION     benign  . colonoscopies    . COLONOSCOPY WITH PROPOFOL N/A 12/13/2016   Procedure: COLONOSCOPY WITH PROPOFOL;  Surgeon: Manya Silvas, MD;  Location: [facility] ENDOSCOPY;  Service: Endoscopy;  Laterality: N/A;    Prior to Admission medications   Medication Sig Start Date End Date Taking? Authorizing Provider  alendronate (FOSAMAX) 70 MG tablet Take 70 mg by mouth once a week. Take with a full glass of water on an empty stomach.   Yes [provider]  calcium carbonate (OSCAL) 1500 (600 Ca) MG TABS tablet Take 1,500 mg by mouth 2 (two) times daily with a meal.   Yes [provider]  cholecalciferol (VITAMIN D) 400 units TABS tablet Take 1,000 Units by mouth.   Yes  [provider]  loratadine (CLARITIN) 10 MG tablet Take 10 mg by mouth daily.   Yes [provider]  lovastatin (MEVACOR) 40 MG tablet Take 40 mg by mouth at bedtime.   Yes [provider]  Multiple Vitamin (MULTIVITAMIN) tablet Take 1 tablet by mouth daily.   Yes [provider]  triamterene-hydrochlorothiazide (MAXZIDE-25) 37.5-25 MG tablet Take 1 tablet by mouth daily.   Yes [provider]    Allergies Patient has no known allergies.  Family History  Problem Relation Age of Onset  . Breast cancer Neg Hx     Social History Social History   Tobacco Use  . Smoking status: Never Smoker  . Smokeless tobacco: Never Used  Substance Use Topics  . Alcohol use: Not Currently  . Drug use: Not on file    Review of Systems Constitutional: No fever/chills denies any issues with her hematologic problems or problems with her immune system Eyes: No visual changes. ENT: No sore throat. Cardiovascular: Denies chest pain. Respiratory: Denies shortness of breath. Gastrointestinal: No abdominal pain.   Genitourinary: Negative for dysuria. Musculoskeletal: Negative for back pain. Skin: HPI Neurological: Negative for headaches, areas of focal weakness or numbness.    ____________________________________________   PHYSICAL EXAM:  VITAL SIGNS: ED Triage Vitals  Enc Vitals Group     BP 10/31/18 1031 (!) 172/114     Pulse Rate 10/31/18  1031 (!) 132     Resp 10/31/18 1031 18     Temp 10/31/18 1031 98.1 F (36.7 C)     Temp Source 10/31/18 1031 Oral     SpO2 10/31/18 1031 99 %     Weight 10/31/18 1032 123 lb (55.8 kg)     Height 10/31/18 1032 5\' 5"  (1.651 m)     Head Circumference --      Peak Flow --      Pain Score 10/31/18 1032 3     Pain Loc --      Pain Edu? --      Excl. in [name]? --     Constitutional: Alert and oriented. Well appearing and in no acute distress. Eyes: Conjunctivae are normal. Head: Atraumatic. Nose: No  congestion/rhinnorhea. Mouth/Throat: Mucous membranes are moist. Neck: No stridor.  Cardiovascular: Slightly tachycardic with heart rate of about 100.  Normal rate, regular rhythm. Grossly normal heart sounds.  Good peripheral circulation. Respiratory: Normal respiratory effort.  No retractions. Lungs CTAB. Gastrointestinal: Soft and nontender. No distention. Musculoskeletal: No lower extremity tenderness nor edema.  Over the left posterior thigh she has an area about the size of a half dollar that is firm and nodular and also has about a hand size of surrounding cellulitic changes with erythema. Neurologic:  Normal speech and language. No gross focal neurologic deficits are appreciated.  Skin:  Skin is warm, dry and intact. No rash noted. Psychiatric: Mood and affect are normal. Speech and behavior are normal.  ____________________________________________   LABS (all labs ordered are listed, but only abnormal results are displayed)  Labs Reviewed  CBC WITH DIFFERENTIAL/PLATELET - Abnormal; Notable for the following components:      Result Value   WBC 16.7 (*)    Hemoglobin 15.6 (*)    HCT 46.2 (*)    Neutro Abs 13.7 (*)    Monocytes Absolute 1.2 (*)    Abs Immature Granulocytes 0.11 (*)    All other components within normal limits  BASIC METABOLIC PANEL - Abnormal; Notable for the following components:   Potassium 3.3 (*)    Glucose, Bld 110 (*)    All other components within normal limits  CULTURE, BLOOD (ROUTINE X 2)  CULTURE, BLOOD (ROUTINE X 2)  LACTIC ACID, PLASMA   ____________________________________________  EKG   ____________________________________________  RADIOLOGY  [place] Lt Lower Extrem Ltd Soft Tissue Non Vascular  Result Date: 10/31/2018 CLINICAL DATA:  Erythema and pain.  Open wound. EXAM: ULTRASOUND LEFT LOWER EXTREMITY LIMITED TECHNIQUE: Ultrasound examination of the lower extremity soft tissues was performed in the area of clinical concern. COMPARISON:   None. FINDINGS: There is a heterogeneous collection correlating with the patient's symptoms. The collection measures at least 3.5 by 1.8 cm. IMPRESSION: There is a heterogeneous collection in the region of the patient's symptoms. This could represent a phlegmon or a complicated abscess with debris. Electronically Signed   By: Dorise Bullion III M.D   On: 10/31/2018 11:47    ____________________________________________   PROCEDURES  Procedure(s) performed: None  Procedures  Critical Care performed: No  ____________________________________________   INITIAL IMPRESSION / ASSESSMENT AND PLAN / ED COURSE  Pertinent labs & imaging results that were available during my care of the patient were reviewed by me and considered in my medical decision making (see chart for details).   Patient presents for evaluation for concerns of infection behind the left leg.  There is an obvious abscess in the region with surrounding cellulitis.  It is fairly large and has a small area in the very center looks slightly necrotic prompting general surgery consultation.  Patient is initially tachycardic, but also reports she felt very anxious about coming in she has not had any systemic symptoms on review of systems and she appears quite well.  No other symptomatology but obvious abscess with slight surrounding erythema  ----------------------------------------- 3:00 PM on 10/31/2018 -----------------------------------------  Had a fairly long discussion with the patient regarding benefits of hospitalization versus outpatient management.  She is comfortable with outpatient management, due to coronavirus she would like to minimize any risk to herself of this exposure no I reassured her the hospital is working hard to minimize this I do not think her concern is unreasonable either.  She is comfortable with the plan to go home with antibiotics, follow-up with general surgery and also with PCP in the interim.  She is  awake alert her vital signs have normalized after just a small amount of fluid and she is more at ease I suspect some of her tachycardia was from walking over here and being slightly anxious.        ____________________________________________   FINAL CLINICAL IMPRESSION(S) / ED DIAGNOSES  Final diagnoses:  Abscess        Note:  This document was prepared using Dragon voice recognition software and may include unintentional dictation errors       Delman Kitten, MD 10/31/18 2102

## 2018-10-31 NOTE — Procedures (Signed)
SURGICAL OPERATIVE REPORT  DATE OF PROCEDURE: 10/31/2018  ATTENDING: Corene Cornea E. Rosana Hoes, MD  ANESTHESIA: Local  PRE-OPERATIVE DIAGNOSIS: Left posterior thigh loculated subcutaneous abscess (icd-10's: L02.416)  POST-OPERATIVE DIAGNOSIS: Left posterior thigh loculated subcutaneous abscess (icd-10's: L02.416)  PROCEDURE(S):  1.) Incision and drainage of complex/loculated Left posterior thigh abscess (cpt: 10061)  INTRAOPERATIVE FINDINGS: ~15 mL of thick beige purulent fluid under pressure from Left posterior thigh  INTRAVENOUS FLUIDS: 0 mL crystalloid   ESTIMATED BLOOD LOSS: Minimal (<20 mL)   URINE OUTPUT: No Foley catheter   SPECIMENS: Contents of Left posterior thigh abscess for culture  IMPLANTS: None  DRAINS: None  COMPLICATIONS: None apparent  CONDITION AT END OF PROCEDURE: Hemodynamically stable and awake  DISPOSITION OF PATIENT: ED  INDICATIONS FOR PROCEDURE:  Patient is a 68 y.o. female who presented to [facility] ED today for Left posterior thigh pain and erythema/swelling, for which patient soft tissue ultrasound was performed, antibiotic was administered, and general surgery consultation was requested. All risks, benefits, and alternatives to incision and drainage of Left posterior thigh abscess were discussed with the patient, all of patient's questions were answered to her expressed satisfaction, and informed consent was obtained and documented.  DETAILS OF PROCEDURE: While remaining in patient's ED room, patient was appropriately identified. Laying with her Right side down, operative site was prepped and draped in the usual sterile fashion, and following a brief time out, local anesthetic was injected subcutaneously, and the focus of maximal fluctuance was identified and a 2 cm incision was made using a #11 blade scalpel, upon which >15 mL of thick cream-colored purulent fluid was immediately released under  significant pressure and deep abscess culture was obtained. Additional local anesthetic was injected, and a circle of skin was excised. Intra-cavitary loculations/septations were disrupted using a hemostat, and additional fluid was expressed.   The abscess cavity was then irrigated and suctioned. A dry gauze was inserted and removed, after which sterile gauze was advanced into the former abscess cavity to promote drainage and prevent closure of the skin over the drained abscess cavity. Surrounding skin was then cleaned and dried, and a sterile dry gauze and adhesive dressing was applied. Patient remained in her ED room, reported immediate relief of painful pressure.  I was present for all aspects of the above procedure, and no operative complications were apparent.

## 2018-10-31 NOTE — ED Notes (Addendum)
Returned from [facility]. C/o of pain 3//10 while sitting. ABX started, ns bolus infusing. Left buttock cellulitis/wound circular red and hot to touch, middle of circle black when squeezed yellowish drainage noted. Awaiting surgery consult as per patient. Will continue to monitor. Vss. Patient afebrile att present time.

## 2018-10-31 NOTE — ED Triage Notes (Signed)
Pt sent over from walk-in clinic for abscess/boil to posterior L leg right under buttocks. Pt states started a week ago with "small pimple" and has become larger. No drainage. Pt appears in pain while sitting.

## 2018-10-31 NOTE — ED Notes (Signed)
Patient tolerated I&D well. ABX infusing awaiting md to discuss disposition

## 2018-10-31 NOTE — ED Notes (Signed)
DR Rosana Hoes at bedside to assess and plan further care.

## 2018-10-31 NOTE — Consult Note (Signed)
Pharmacy Antibiotic Note  Tracey Bradshaw is a 68 y.o. female admitted on 10/31/2018 with cellulitis.  Patient has abscess on left leg. Pharmacy has been consulted for Vancomycin dosing.  Plan: Ordered Vancomycin loading dose of 1250 mg IV x 1.  Will order Vancomycin 1000 mg q24h for maintenance dosing Vancomycin Kinetics Using total BW (TBW < IBW), Vd 0.72, Scr 0.88 Goal AUC 400-550 Anticipated AUC 506.6  Height: 5\' 5"  (165.1 cm) Weight: 123 lb (55.8 kg) IBW/kg (Calculated) : 57  Temp (24hrs), Avg:98.1 F (36.7 C), Min:98.1 F (36.7 C), Max:98.1 F (36.7 C)  Recent Labs  Lab 10/31/18 1058  WBC 16.7*  CREATININE 0.88  LATICACIDVEN 1.3    Estimated Creatinine Clearance: 53.9 mL/min (by C-G formula based on SCr of 0.88 mg/dL).    No Known Allergies  Antimicrobials this admission: 4/25 Ceftriaxone >>  4/25 Vancomycin >>   Dose adjustments this admission: N/A  Microbiology results: 4/25 BCx: pending   Thank you for allowing pharmacy to be a part of this patient's care.  Paticia Stack, PharmD Pharmacy Resident  10/31/2018 11:41 AM

## 2018-10-31 NOTE — Consult Note (Signed)
SURGICAL CONSULTATION NOTE (initial) - cpt: [id number]  HISTORY OF PRESENT ILLNESS (HPI):  68 y.o. female presented to [facility] ED today for evaluation of worsening Left posterior thigh pain, redness, and swelling. Patient reports she first noticed a mildly painful "pimple" 9 days ago, similar to which she's experienced previously, always with spontaneous resolution after 3 - 4 days. On the same day she noticed this Left posterior thigh "pimple", she was also prescribed a 7 day prednisone taper for severe sinusitis, which was relieved following initiation of prednisone. Since that time, however, her Left posterior thigh pain, redness, and swelling have worsened substantially, prompting her to present today for further evaluation and management. She says she completed prednisone taper 2 days ago. Patient otherwise denies any fever/chills, N/V, CP, or SOB and says she has not previously experienced similar.  Surgery is consulted by ED physician Dr. Jacqualine Code in this context for evaluation and management of Left posterior thigh abscess.  PAST MEDICAL HISTORY (PMH):  Past Medical History:  Diagnosis Date  . Arthritis   . Hematuria, microscopic   . Hyperlipidemia   . Hypertension   . Rhinitis, allergic     PAST SURGICAL HISTORY ([place]):  Past Surgical History:  Procedure Laterality Date  . BLADDER ASPIRATION     benign  . colonoscopies    . COLONOSCOPY WITH PROPOFOL N/A 12/13/2016   Procedure: COLONOSCOPY WITH PROPOFOL;  Surgeon: Manya Silvas, MD;  Location: [facility] ENDOSCOPY;  Service: Endoscopy;  Laterality: N/A;    MEDICATIONS:  Prior to Admission medications   Medication Sig Start Date End Date Taking? Authorizing Provider  alendronate (FOSAMAX) 70 MG tablet Take 70 mg by mouth once a week. Take with a full glass of water on an empty stomach.    [provider]  calcium carbonate (OSCAL) 1500 (600 Ca) MG TABS tablet Take 1,500 mg by mouth 2 (two) times daily with a meal.    [provider]  cholecalciferol (VITAMIN D) 400 units TABS tablet Take 1,000 Units by mouth.    [provider]  loratadine (CLARITIN) 10 MG tablet Take 10 mg by mouth daily.    [provider]  lovastatin (MEVACOR) 40 MG tablet Take 40 mg by mouth at bedtime.    [provider]  Multiple Vitamin (MULTIVITAMIN) tablet Take 1 tablet by mouth daily.    [provider]  triamterene-hydrochlorothiazide (MAXZIDE-25) 37.5-25 MG tablet Take 1 tablet by mouth daily.    [provider]    ALLERGIES:  No Known Allergies   SOCIAL HISTORY:  Social History   Socioeconomic History  . Marital status: Married    Spouse name: Not on file  . Number of children: Not on file  . Years of education: Not on file  . Highest education level: Not on file  Occupational History  . Not on file  Social Needs  . Financial resource strain: Not on file  . Food insecurity:    Worry: Not on file    Inability: Not on file  . Transportation needs:    Medical: Not on file    Non-medical: Not on file  Tobacco Use  . Smoking status: Never Smoker  . Smokeless tobacco: Never Used  Substance and Sexual Activity  . Alcohol use: Not Currently  . Drug use: Not on file  . Sexual activity: Not on file  Lifestyle  . Physical activity:    Days per week: Not on file    Minutes per session: Not on file  .  Stress: Not on file  Relationships  . Social connections:    Talks on phone: Not on file    Gets together: Not on file    Attends religious service: Not on file    Active member of club or organization: Not on file    Attends meetings of clubs or organizations: Not on file    Relationship status: Not on file  . Intimate partner violence:    Fear of current or ex partner: Not on file    Emotionally abused: Not on file    Physically abused: Not on file    Forced sexual activity: Not on file  Other Topics Concern  . Not on file  Social History Narrative  . Not on file     The patient currently resides (home / rehab facility / nursing home): Home The patient normally is (ambulatory / bedbound): Ambulatory   FAMILY HISTORY:  Family History  Problem Relation Age of Onset  . Breast cancer Neg Hx     REVIEW OF SYSTEMS:  Constitutional: denies weight loss, fever, chills, or sweats  Eyes: denies any other vision changes, history of eye injury  ENT: denies sore throat, hearing problems  Respiratory: denies shortness of breath, wheezing  Cardiovascular: denies chest pain, palpitations  Gastrointestinal: denies abdominal pain, N/V, or diarrhea Genitourinary: denies burning with urination or urinary frequency Musculoskeletal: denies any other joint pains or cramps  Skin: denies any other rashes or skin discolorations except as per HPI Neurological: denies any other headache, dizziness, weakness  Psychiatric: denies any other depression, anxiety   All other review of systems were negative   VITAL SIGNS:  Temp:  [98.1 F (36.7 C)-98.6 F (37 C)] 98.6 F (37 C) (04/25 1138) Pulse Rate:  [109-132] 109 (04/25 1138) Resp:  [17-18] 17 (04/25 1138) BP: (149-172)/(86-114) 149/95 (04/25 1138) SpO2:  [99 %-100 %] 100 % (04/25 1138) Weight:  [55.8 kg] 55.8 kg (04/25 1032)     Height: 5\' 5"  (165.1 cm) Weight: 55.8 kg BMI (Calculated): 20.47   INTAKE/OUTPUT:  This shift: No intake/output data recorded.  Last 2 shifts: @IOLAST2SHIFTS @   PHYSICAL EXAM:  Constitutional:  -- Normal body habitus  -- Awake, alert, and oriented x3, no apparent distress Eyes:  -- Pupils equally round and reactive to light  -- No scleral icterus, B/L no occular discharge Ear, nose, throat: -- Neck is FROM WNL -- No jugular venous distension  Pulmonary:  -- No wheezes or rhales -- Equal breath sounds bilaterally -- Breathing non-labored at rest Cardiovascular:  -- S1, S2 present  -- No pericardial rubs  Gastrointestinal:  -- Abdomen soft, nontender, non-distended, no  guarding or rebound tenderness -- No abdominal masses appreciated, pulsatile or otherwise  Musculoskeletal and Integumentary:  -- Wounds or skin discoloration: 2.5-3 cm diameter tender to palpation Left posterior thigh lesion with palpable fluctuance and surrounding erythema and induration -- Extremities: B/L UE and LE FROM, hands and feet warm, no edema (except local Left posterior thigh wound as described above)  Neurologic:  -- Motor function: Intact and symmetric -- Sensation: Intact and symmetric Psychiatric:  -- Mood and affect WNL  Labs:  CBC Latest Ref Rng & Units 10/31/2018  WBC 4.0 - 10.5 K/uL 16.7(H)  Hemoglobin 12.0 - 15.0 g/dL 15.6(H)  Hematocrit 36.0 - 46.0 % 46.2(H)  Platelets 150 - 400 K/uL 306   CMP Latest Ref Rng & Units 10/31/2018  Glucose 70 - 99 mg/dL 110(H)  BUN 8 - 23 mg/dL 19  Creatinine 0.44 - 1.00 mg/dL 0.88  Sodium 135 - 145 mmol/L 138  Potassium 3.5 - 5.1 mmol/L 3.3(L)  Chloride 98 - 111 mmol/L 99  CO2 22 - 32 mmol/L 28  Calcium 8.9 - 10.3 mg/dL 9.3   Imaging studies:  Left Posterior Thigh Soft Tissue Ultrasound (10/31/2018) - images personally reviewed and discussed with patient There is a heterogeneous collection in the region of the patient's symptoms. This could represent a phlegmon or a complicated abscess with debris.  Assessment/Plan: (ICD-10's: L02.416) 68 y.o. female with Left posterior thigh abscess, complicated by recent oral systemic steroids (prenisone taper) for sinusitis and by comorbidities including HTN, HLD, and osteoarthritis.   - antibiotics ordered by ED  - discussed with patient ultrasound results   - all risks, benefits, and alternatives to incision and drainage of Left posterior thigh abscess were discussed with the patient, all of her questions were answered to her expressed satisfaction, patient expresses she wishes to proceed, and informed consent was obtained.  - will proceed with bedside incision and drainage of Left  posterior thigh abscess   - disposition to be determined following pending abscess drainage  - follow up resulting cultures  All of the above findings and recommendations were discussed with the patient and ED physician, and all of patient's questions were answered to her expressed satisfaction.  Thank you for the opportunity to participate in this patient's care.   -- [name] Rosana Hoes, MD, [place]: [facility] - Partnering for exceptional care. Office: [phone]

## 2018-11-04 DIAGNOSIS — L02416 Cutaneous abscess of left lower limb: Secondary | ICD-10-CM | POA: Insufficient documentation

## 2018-11-05 LAB — AEROBIC/ANAEROBIC CULTURE W GRAM STAIN (SURGICAL/DEEP WOUND): Special Requests: NORMAL

## 2018-11-05 LAB — CULTURE, BLOOD (ROUTINE X 2)
Culture: NO GROWTH
Culture: NO GROWTH
Special Requests: ADEQUATE

## 2018-11-05 LAB — AEROBIC/ANAEROBIC CULTURE (SURGICAL/DEEP WOUND)

## 2018-11-12 ENCOUNTER — Other Ambulatory Visit: Payer: Self-pay

## 2018-11-12 ENCOUNTER — Encounter: Payer: Self-pay | Admitting: Surgery

## 2018-11-12 ENCOUNTER — Ambulatory Visit (INDEPENDENT_AMBULATORY_CARE_PROVIDER_SITE_OTHER): Payer: PPO | Admitting: Surgery

## 2018-11-12 VITALS — BP 155/92 | HR 98 | Temp 98.1°F | Resp 14 | Ht 65.0 in | Wt 126.4 lb

## 2018-11-12 DIAGNOSIS — E785 Hyperlipidemia, unspecified: Secondary | ICD-10-CM | POA: Insufficient documentation

## 2018-11-12 DIAGNOSIS — M1990 Unspecified osteoarthritis, unspecified site: Secondary | ICD-10-CM | POA: Insufficient documentation

## 2018-11-12 DIAGNOSIS — I10 Essential (primary) hypertension: Secondary | ICD-10-CM | POA: Insufficient documentation

## 2018-11-12 DIAGNOSIS — R3129 Other microscopic hematuria: Secondary | ICD-10-CM | POA: Insufficient documentation

## 2018-11-12 DIAGNOSIS — L02416 Cutaneous abscess of left lower limb: Secondary | ICD-10-CM

## 2018-11-12 DIAGNOSIS — M810 Age-related osteoporosis without current pathological fracture: Secondary | ICD-10-CM | POA: Insufficient documentation

## 2018-11-12 DIAGNOSIS — Z4889 Encounter for other specified surgical aftercare: Secondary | ICD-10-CM

## 2018-11-12 NOTE — Progress Notes (Signed)
Surgical Clinic Progress/Follow-up Note   HPI:  68 y.o. Female presents to clinic for post-procedural follow-up 12 Days s/p incision and drainage of Left posterior thigh abscess Tracey Bradshaw, 10/31/2018). Patient reports her Left posterior thigh pain and wound have much improved with no further purulent drainage. She has been changing a small amount of superficial gauze packing/dressing once daily, but she has also been applying barrier ointment around edges of her wound to prevent gauze from sticking and moistening gauze with water prior to removing it. She describes minimal pain, denies fever/chills, and no surrounding redness. She also completed her prescribed course of oral systemic antibiotics 2 days ago with no subsequent adverse complaints.  Review of Systems:  Constitutional: denies fever/chills  Respiratory: denies shortness of breath, wheezing  Cardiovascular: denies chest pain, palpitations  Gastrointestinal: denies abdominal pain, N/V, or diarrhea Skin: Denies any other rashes or skin discolorations except as per interval history  Vital Signs:  BP (!) 155/92   Pulse 98   Temp 98.1 F (36.7 C) (Temporal)   Resp 14   Ht 5\' 5"  (1.651 m)   Wt 126 lb 6.4 oz (57.3 kg)   SpO2 98%   BMI 21.03 kg/m    Physical Exam:  Constitutional:  -- Normal body habitus  -- Awake, alert, and oriented x3  Pulmonary:  -- No crackles -- Equal breath sounds bilaterally -- Breathing non-labored at rest Cardiovascular:  -- S1, S2 present  -- No pericardial rubs  Musculoskeletal / Integumentary:  -- Wounds or skin discoloration: Superficial <~1 cm deep former Left posterior thigh abscess cavity packed with small amount of moistened gauze without any surrounding erythema and no purulent drainage, minimal fibrinous exudate over viable pink beefy granulation tissue, no associated crepitus -- Extremities: B/L UE and LE FROM, hands and feet warm, no edema   Posterior Thigh Abscess Culture  (10/31/2018) ABUNDANT METHICILLIN RESISTANT STAPHYLOCOCCUS AUREUS sensitive to prescribed Bactrim  Imaging: No new pertinent imaging available for review   Assessment:  68 y.o. yo Female with a problem list including...  Patient Active Problem List   Diagnosis Date Noted  . Arthritis 11/12/2018  . Hyperlipidemia 11/12/2018  . Hypertension 11/12/2018  . Microscopic hematuria 11/12/2018  . Osteoporosis, post-menopausal 11/12/2018  . Abscess of left thigh   . CKD (chronic kidney disease) stage 3, GFR 30-59 ml/min (HCC) 08/02/2015    presents to clinic for post-procedural follow-up evaluation, doing well 12 Days s/p incision and drainage of Left posterior thigh MRSA+ abscess Tracey Bradshaw, 10/31/2018).  Plan:              - change dry gauze packing once daily without wetting or ointment             - okay to shower, but avoid submerging incisions under water until wound healed  - offered additional routine wound follow-up, but patient expresses preference to follow up as needed             - return to clinic as needed, instructed to call office if any questions or concerns  All of the above recommendations were discussed with the patient, and all of patient's questions were answered to her expressed satisfaction.  -- [name] Tracey Hoes, MD, [place]: [facility] - Partnering for exceptional care. Office: [phone]

## 2018-11-12 NOTE — Patient Instructions (Signed)
Return as needed.The patient is aware to call back for any questions or concerns.  

## 2018-11-13 ENCOUNTER — Encounter: Payer: Self-pay | Admitting: Surgery

## 2018-11-30 DIAGNOSIS — R319 Hematuria, unspecified: Secondary | ICD-10-CM | POA: Diagnosis not present

## 2018-11-30 DIAGNOSIS — R350 Frequency of micturition: Secondary | ICD-10-CM | POA: Diagnosis not present

## 2019-01-01 ENCOUNTER — Other Ambulatory Visit: Payer: Self-pay | Admitting: Internal Medicine

## 2019-01-01 DIAGNOSIS — Z1231 Encounter for screening mammogram for malignant neoplasm of breast: Secondary | ICD-10-CM

## 2019-01-19 DIAGNOSIS — Z08 Encounter for follow-up examination after completed treatment for malignant neoplasm: Secondary | ICD-10-CM | POA: Diagnosis not present

## 2019-01-19 DIAGNOSIS — L821 Other seborrheic keratosis: Secondary | ICD-10-CM | POA: Diagnosis not present

## 2019-01-19 DIAGNOSIS — D225 Melanocytic nevi of trunk: Secondary | ICD-10-CM | POA: Diagnosis not present

## 2019-01-19 DIAGNOSIS — D2271 Melanocytic nevi of right lower limb, including hip: Secondary | ICD-10-CM | POA: Diagnosis not present

## 2019-01-19 DIAGNOSIS — D2261 Melanocytic nevi of right upper limb, including shoulder: Secondary | ICD-10-CM | POA: Diagnosis not present

## 2019-01-19 DIAGNOSIS — L538 Other specified erythematous conditions: Secondary | ICD-10-CM | POA: Diagnosis not present

## 2019-01-19 DIAGNOSIS — D2272 Melanocytic nevi of left lower limb, including hip: Secondary | ICD-10-CM | POA: Diagnosis not present

## 2019-01-19 DIAGNOSIS — D2262 Melanocytic nevi of left upper limb, including shoulder: Secondary | ICD-10-CM | POA: Diagnosis not present

## 2019-01-19 DIAGNOSIS — Z85828 Personal history of other malignant neoplasm of skin: Secondary | ICD-10-CM | POA: Diagnosis not present

## 2019-01-19 DIAGNOSIS — L820 Inflamed seborrheic keratosis: Secondary | ICD-10-CM | POA: Diagnosis not present

## 2019-02-08 ENCOUNTER — Other Ambulatory Visit: Payer: Self-pay

## 2019-02-08 ENCOUNTER — Ambulatory Visit
Admission: RE | Admit: 2019-02-08 | Discharge: 2019-02-08 | Disposition: A | Discharge status: Home / Self Care | Payer: PPO | Source: Ambulatory Visit | Attending: Internal Medicine | Admitting: Internal Medicine

## 2019-02-08 DIAGNOSIS — Z1231 Encounter for screening mammogram for malignant neoplasm of breast: Secondary | ICD-10-CM | POA: Diagnosis not present

## 2019-02-10 DIAGNOSIS — D751 Secondary polycythemia: Secondary | ICD-10-CM | POA: Diagnosis not present

## 2019-02-10 DIAGNOSIS — E7800 Pure hypercholesterolemia, unspecified: Secondary | ICD-10-CM | POA: Diagnosis not present

## 2019-02-10 DIAGNOSIS — R739 Hyperglycemia, unspecified: Secondary | ICD-10-CM | POA: Diagnosis not present

## 2019-02-10 DIAGNOSIS — I10 Essential (primary) hypertension: Secondary | ICD-10-CM | POA: Diagnosis not present

## 2019-02-15 DIAGNOSIS — H25813 Combined forms of age-related cataract, bilateral: Secondary | ICD-10-CM | POA: Diagnosis not present

## 2019-02-17 DIAGNOSIS — R3129 Other microscopic hematuria: Secondary | ICD-10-CM | POA: Diagnosis not present

## 2019-02-17 DIAGNOSIS — E7800 Pure hypercholesterolemia, unspecified: Secondary | ICD-10-CM | POA: Diagnosis not present

## 2019-02-17 DIAGNOSIS — I10 Essential (primary) hypertension: Secondary | ICD-10-CM | POA: Diagnosis not present

## 2019-02-17 DIAGNOSIS — N183 Chronic kidney disease, stage 3 (moderate): Secondary | ICD-10-CM | POA: Diagnosis not present

## 2019-02-17 DIAGNOSIS — M810 Age-related osteoporosis without current pathological fracture: Secondary | ICD-10-CM | POA: Diagnosis not present

## 2019-02-24 DIAGNOSIS — M810 Age-related osteoporosis without current pathological fracture: Secondary | ICD-10-CM | POA: Diagnosis not present

## 2019-07-12 DIAGNOSIS — S92322A Displaced fracture of second metatarsal bone, left foot, initial encounter for closed fracture: Secondary | ICD-10-CM | POA: Diagnosis not present

## 2019-07-12 DIAGNOSIS — S99922A Unspecified injury of left foot, initial encounter: Secondary | ICD-10-CM | POA: Diagnosis not present

## 2019-07-19 DIAGNOSIS — S92325A Nondisplaced fracture of second metatarsal bone, left foot, initial encounter for closed fracture: Secondary | ICD-10-CM | POA: Diagnosis not present

## 2019-07-19 DIAGNOSIS — M25475 Effusion, left foot: Secondary | ICD-10-CM | POA: Diagnosis not present

## 2019-07-19 DIAGNOSIS — R262 Difficulty in walking, not elsewhere classified: Secondary | ICD-10-CM | POA: Diagnosis not present

## 2019-07-19 DIAGNOSIS — M79672 Pain in left foot: Secondary | ICD-10-CM | POA: Diagnosis not present

## 2019-08-04 DIAGNOSIS — M79672 Pain in left foot: Secondary | ICD-10-CM | POA: Diagnosis not present

## 2019-08-04 DIAGNOSIS — R262 Difficulty in walking, not elsewhere classified: Secondary | ICD-10-CM | POA: Diagnosis not present

## 2019-08-04 DIAGNOSIS — S92325D Nondisplaced fracture of second metatarsal bone, left foot, subsequent encounter for fracture with routine healing: Secondary | ICD-10-CM | POA: Diagnosis not present

## 2019-08-04 DIAGNOSIS — M25475 Effusion, left foot: Secondary | ICD-10-CM | POA: Diagnosis not present

## 2019-08-18 DIAGNOSIS — M79672 Pain in left foot: Secondary | ICD-10-CM | POA: Diagnosis not present

## 2019-08-18 DIAGNOSIS — N183 Chronic kidney disease, stage 3 unspecified: Secondary | ICD-10-CM | POA: Diagnosis not present

## 2019-08-18 DIAGNOSIS — M25475 Effusion, left foot: Secondary | ICD-10-CM | POA: Diagnosis not present

## 2019-08-18 DIAGNOSIS — I10 Essential (primary) hypertension: Secondary | ICD-10-CM | POA: Diagnosis not present

## 2019-08-18 DIAGNOSIS — S92325D Nondisplaced fracture of second metatarsal bone, left foot, subsequent encounter for fracture with routine healing: Secondary | ICD-10-CM | POA: Diagnosis not present

## 2019-08-18 DIAGNOSIS — E7800 Pure hypercholesterolemia, unspecified: Secondary | ICD-10-CM | POA: Diagnosis not present

## 2019-08-18 DIAGNOSIS — M810 Age-related osteoporosis without current pathological fracture: Secondary | ICD-10-CM | POA: Diagnosis not present

## 2019-08-18 DIAGNOSIS — R262 Difficulty in walking, not elsewhere classified: Secondary | ICD-10-CM | POA: Diagnosis not present

## 2019-08-25 DIAGNOSIS — R3129 Other microscopic hematuria: Secondary | ICD-10-CM | POA: Diagnosis not present

## 2019-08-25 DIAGNOSIS — R739 Hyperglycemia, unspecified: Secondary | ICD-10-CM | POA: Diagnosis not present

## 2019-08-25 DIAGNOSIS — D582 Other hemoglobinopathies: Secondary | ICD-10-CM | POA: Diagnosis not present

## 2019-08-25 DIAGNOSIS — Z0000 Encounter for general adult medical examination without abnormal findings: Secondary | ICD-10-CM | POA: Diagnosis not present

## 2019-08-25 DIAGNOSIS — M1990 Unspecified osteoarthritis, unspecified site: Secondary | ICD-10-CM | POA: Diagnosis not present

## 2019-08-25 DIAGNOSIS — M810 Age-related osteoporosis without current pathological fracture: Secondary | ICD-10-CM | POA: Diagnosis not present

## 2019-08-25 DIAGNOSIS — I10 Essential (primary) hypertension: Secondary | ICD-10-CM | POA: Diagnosis not present

## 2019-08-25 DIAGNOSIS — E7800 Pure hypercholesterolemia, unspecified: Secondary | ICD-10-CM | POA: Diagnosis not present

## 2020-01-12 ENCOUNTER — Other Ambulatory Visit: Payer: Self-pay | Admitting: Internal Medicine

## 2020-01-12 DIAGNOSIS — Z1231 Encounter for screening mammogram for malignant neoplasm of breast: Secondary | ICD-10-CM

## 2020-01-18 DIAGNOSIS — D225 Melanocytic nevi of trunk: Secondary | ICD-10-CM | POA: Diagnosis not present

## 2020-01-18 DIAGNOSIS — D2262 Melanocytic nevi of left upper limb, including shoulder: Secondary | ICD-10-CM | POA: Diagnosis not present

## 2020-01-18 DIAGNOSIS — D2261 Melanocytic nevi of right upper limb, including shoulder: Secondary | ICD-10-CM | POA: Diagnosis not present

## 2020-01-18 DIAGNOSIS — L570 Actinic keratosis: Secondary | ICD-10-CM | POA: Diagnosis not present

## 2020-01-18 DIAGNOSIS — L821 Other seborrheic keratosis: Secondary | ICD-10-CM | POA: Diagnosis not present

## 2020-01-18 DIAGNOSIS — X32XXXA Exposure to sunlight, initial encounter: Secondary | ICD-10-CM | POA: Diagnosis not present

## 2020-01-18 DIAGNOSIS — Z85828 Personal history of other malignant neoplasm of skin: Secondary | ICD-10-CM | POA: Diagnosis not present

## 2020-01-18 DIAGNOSIS — D2271 Melanocytic nevi of right lower limb, including hip: Secondary | ICD-10-CM | POA: Diagnosis not present

## 2020-02-08 IMAGING — MG DIGITAL SCREENING BILATERAL MAMMOGRAM WITH TOMO AND CAD
8 series · 9 of 24 positions shown · non-contrast
Comparison: Previous exam(s).

CLINICAL DATA: Screening.

EXAM:
DIGITAL SCREENING BILATERAL MAMMOGRAM WITH TOMO AND CAD

[L CC synth-2D]
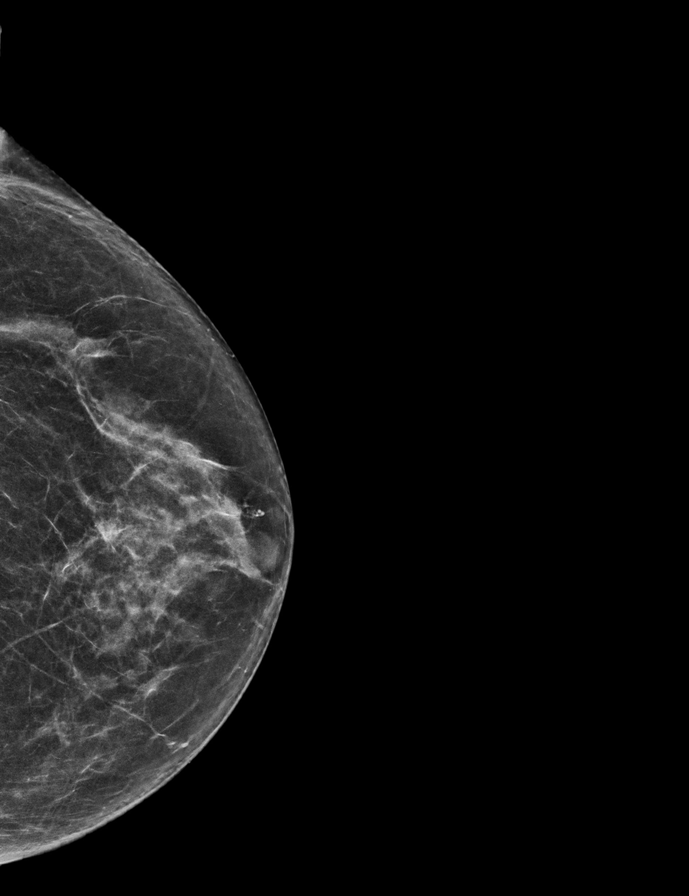

[R CC synth-2D]
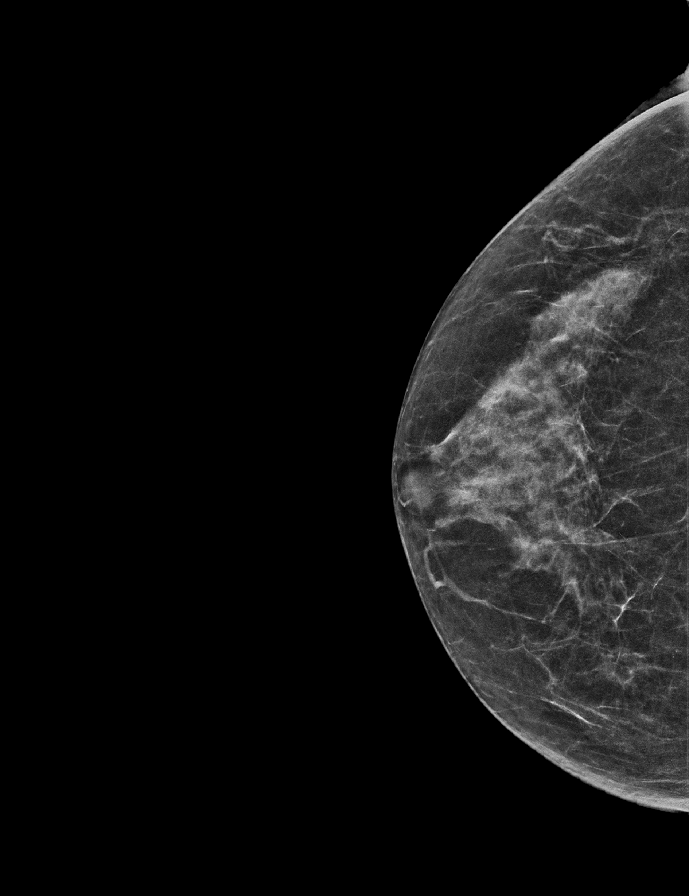

[R MLO synth-2D]
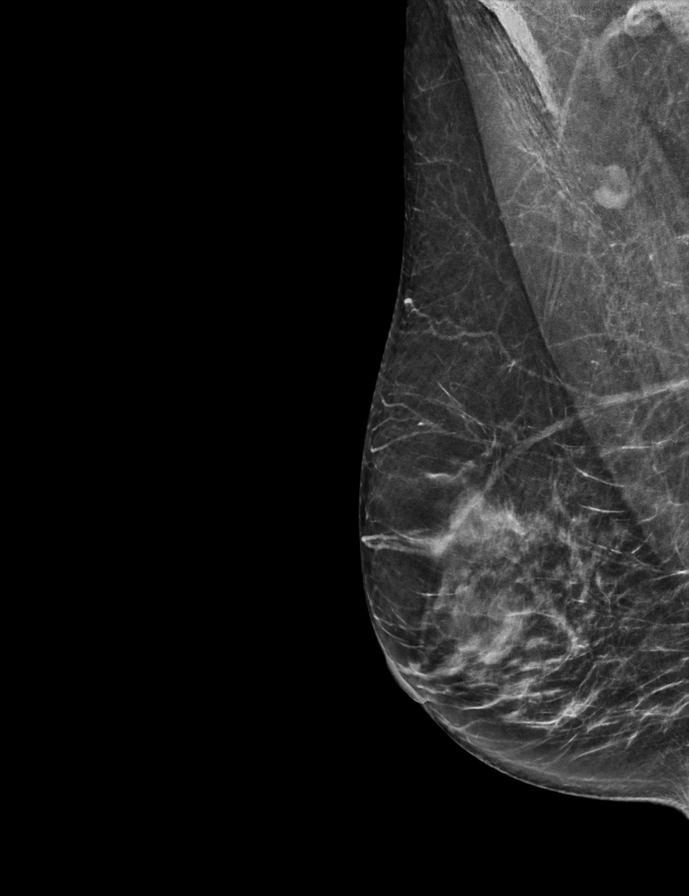

[L MLO synth-2D]
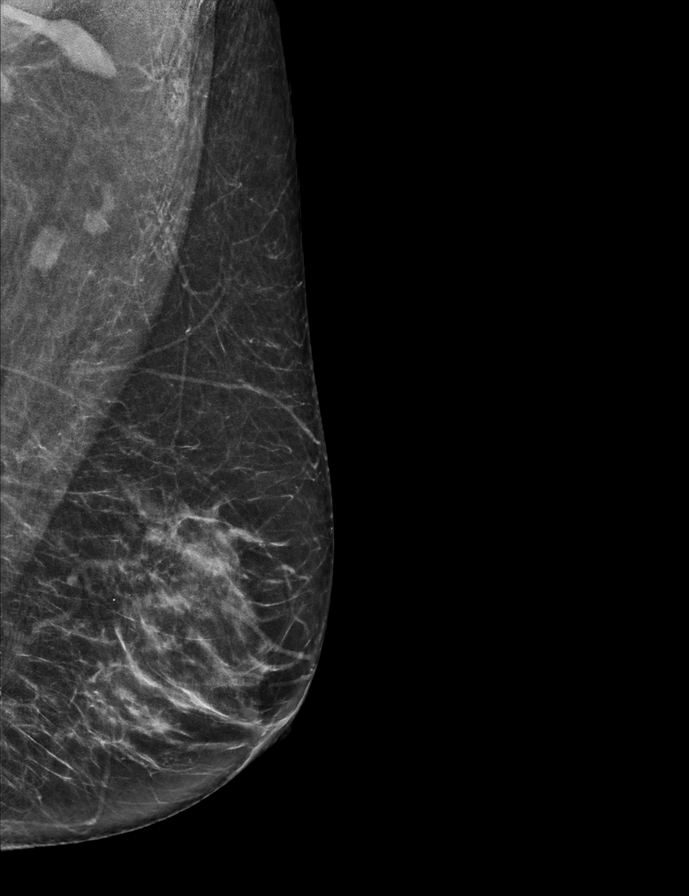

[L CC tomo · 2 of 59 frames shown]
[frame 20/59]
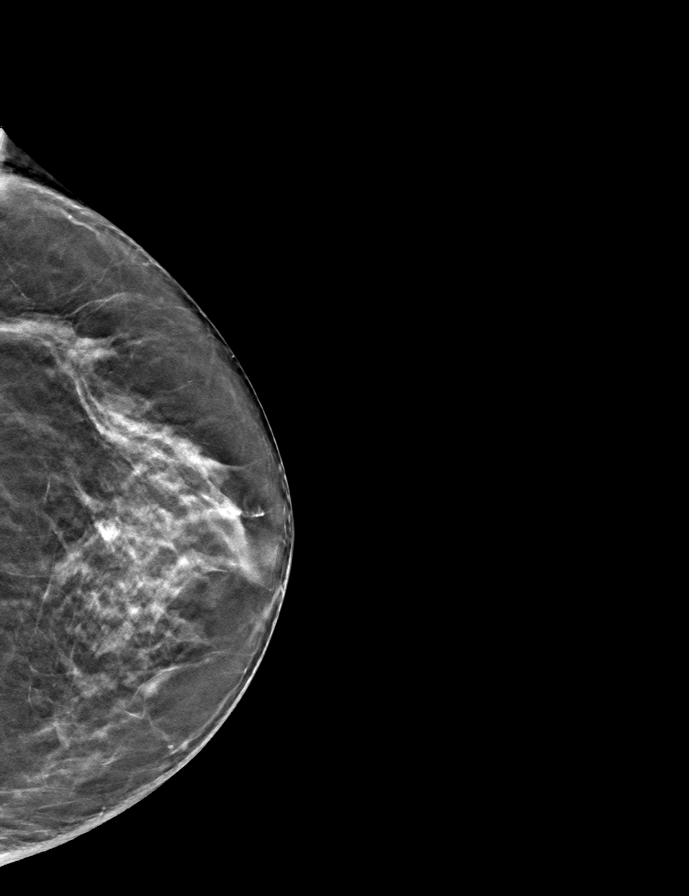
[frame 30/59]
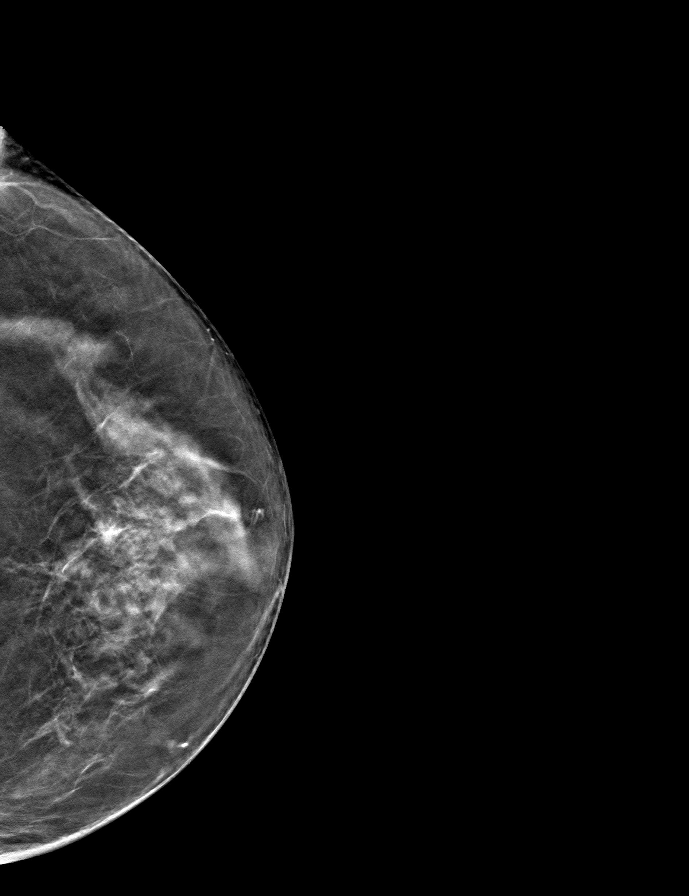

[R CC tomo · tomo slice 27/53.0]
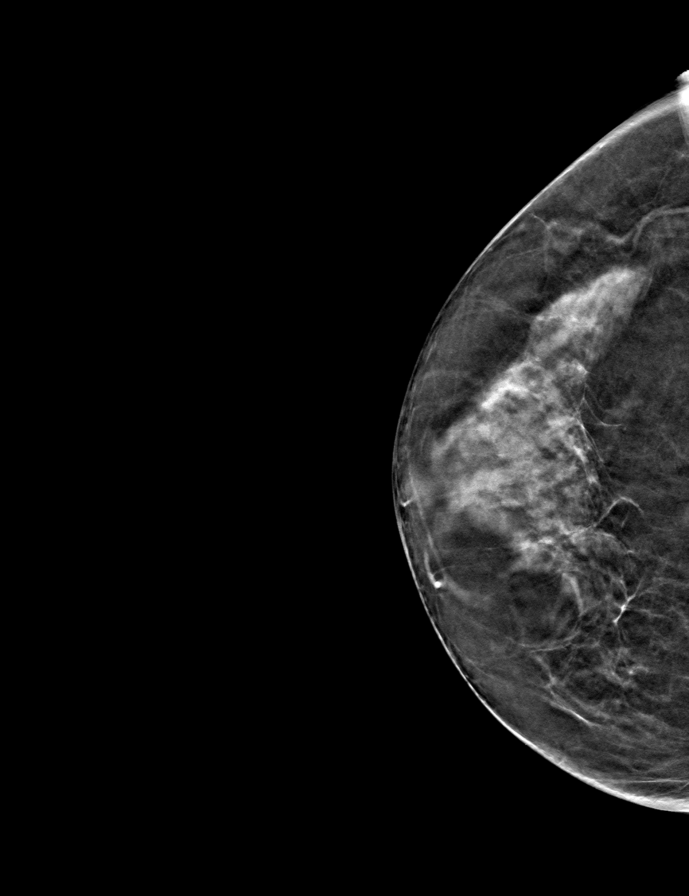

[R MLO tomo · tomo slice 31/60.0]
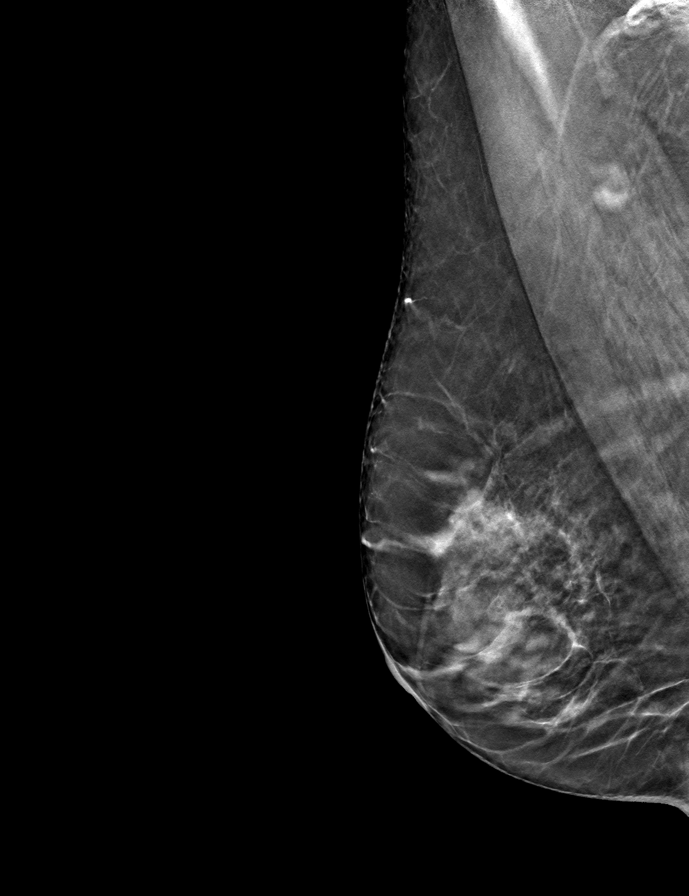

[L MLO tomo · tomo slice 31/60.0]
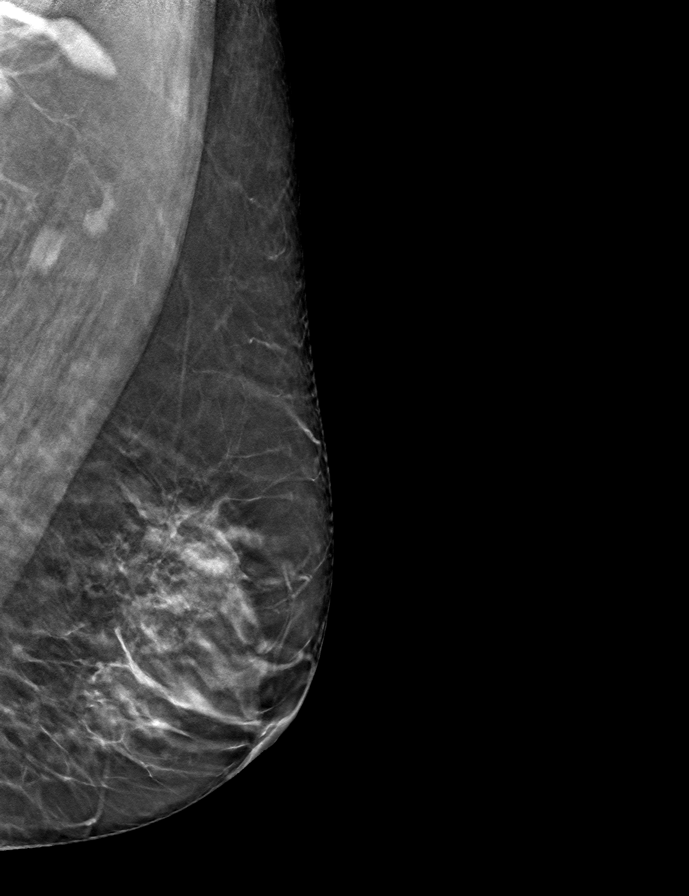

[9 of 24 positions shown; findings below may reference images not displayed]

ACR Breast Density Category b: There are scattered areas of
fibroglandular density.
FINDINGS: There are no findings suspicious for malignancy. Images were
processed with CAD.
IMPRESSION: No mammographic evidence of malignancy. A result letter of this
screening mammogram will be mailed directly to the patient.

RECOMMENDATION:
Screening mammogram in one year. (Code:[id number])

BI-RADS CATEGORY  1: Negative.

## 2020-02-09 ENCOUNTER — Ambulatory Visit
Admission: RE | Admit: 2020-02-09 | Discharge: 2020-02-09 | Disposition: A | Discharge status: Home / Self Care | Payer: PPO | Source: Ambulatory Visit | Attending: Internal Medicine | Admitting: Internal Medicine

## 2020-02-09 DIAGNOSIS — Z1231 Encounter for screening mammogram for malignant neoplasm of breast: Secondary | ICD-10-CM | POA: Diagnosis not present

## 2020-02-16 DIAGNOSIS — R739 Hyperglycemia, unspecified: Secondary | ICD-10-CM | POA: Diagnosis not present

## 2020-02-16 DIAGNOSIS — I10 Essential (primary) hypertension: Secondary | ICD-10-CM | POA: Diagnosis not present

## 2020-02-16 DIAGNOSIS — M1990 Unspecified osteoarthritis, unspecified site: Secondary | ICD-10-CM | POA: Diagnosis not present

## 2020-02-16 DIAGNOSIS — D582 Other hemoglobinopathies: Secondary | ICD-10-CM | POA: Diagnosis not present

## 2020-02-16 DIAGNOSIS — R3129 Other microscopic hematuria: Secondary | ICD-10-CM | POA: Diagnosis not present

## 2020-02-16 DIAGNOSIS — E7800 Pure hypercholesterolemia, unspecified: Secondary | ICD-10-CM | POA: Diagnosis not present

## 2020-02-24 DIAGNOSIS — R3129 Other microscopic hematuria: Secondary | ICD-10-CM | POA: Diagnosis not present

## 2020-02-24 DIAGNOSIS — M1990 Unspecified osteoarthritis, unspecified site: Secondary | ICD-10-CM | POA: Diagnosis not present

## 2020-02-24 DIAGNOSIS — I10 Essential (primary) hypertension: Secondary | ICD-10-CM | POA: Diagnosis not present

## 2020-02-24 DIAGNOSIS — D582 Other hemoglobinopathies: Secondary | ICD-10-CM | POA: Diagnosis not present

## 2020-02-24 DIAGNOSIS — M810 Age-related osteoporosis without current pathological fracture: Secondary | ICD-10-CM | POA: Diagnosis not present

## 2020-02-24 DIAGNOSIS — E7800 Pure hypercholesterolemia, unspecified: Secondary | ICD-10-CM | POA: Diagnosis not present

## 2020-08-23 DIAGNOSIS — R3129 Other microscopic hematuria: Secondary | ICD-10-CM | POA: Diagnosis not present

## 2020-08-23 DIAGNOSIS — E7800 Pure hypercholesterolemia, unspecified: Secondary | ICD-10-CM | POA: Diagnosis not present

## 2020-08-23 DIAGNOSIS — D582 Other hemoglobinopathies: Secondary | ICD-10-CM | POA: Diagnosis not present

## 2020-08-30 DIAGNOSIS — M810 Age-related osteoporosis without current pathological fracture: Secondary | ICD-10-CM | POA: Diagnosis not present

## 2020-08-30 DIAGNOSIS — M1990 Unspecified osteoarthritis, unspecified site: Secondary | ICD-10-CM | POA: Diagnosis not present

## 2020-08-30 DIAGNOSIS — D582 Other hemoglobinopathies: Secondary | ICD-10-CM | POA: Diagnosis not present

## 2020-08-30 DIAGNOSIS — E7800 Pure hypercholesterolemia, unspecified: Secondary | ICD-10-CM | POA: Diagnosis not present

## 2020-08-30 DIAGNOSIS — R3129 Other microscopic hematuria: Secondary | ICD-10-CM | POA: Diagnosis not present

## 2020-08-30 DIAGNOSIS — I10 Essential (primary) hypertension: Secondary | ICD-10-CM | POA: Diagnosis not present

## 2020-08-30 DIAGNOSIS — Z0000 Encounter for general adult medical examination without abnormal findings: Secondary | ICD-10-CM | POA: Diagnosis not present

## 2020-10-04 DIAGNOSIS — B9689 Other specified bacterial agents as the cause of diseases classified elsewhere: Secondary | ICD-10-CM | POA: Diagnosis not present

## 2020-10-04 DIAGNOSIS — J019 Acute sinusitis, unspecified: Secondary | ICD-10-CM | POA: Diagnosis not present

## 2021-01-02 ENCOUNTER — Other Ambulatory Visit: Payer: Self-pay | Admitting: Internal Medicine

## 2021-01-02 DIAGNOSIS — Z1231 Encounter for screening mammogram for malignant neoplasm of breast: Secondary | ICD-10-CM

## 2021-01-16 DIAGNOSIS — X32XXXA Exposure to sunlight, initial encounter: Secondary | ICD-10-CM | POA: Diagnosis not present

## 2021-01-16 DIAGNOSIS — D2261 Melanocytic nevi of right upper limb, including shoulder: Secondary | ICD-10-CM | POA: Diagnosis not present

## 2021-01-16 DIAGNOSIS — Z85828 Personal history of other malignant neoplasm of skin: Secondary | ICD-10-CM | POA: Diagnosis not present

## 2021-01-16 DIAGNOSIS — D2271 Melanocytic nevi of right lower limb, including hip: Secondary | ICD-10-CM | POA: Diagnosis not present

## 2021-01-16 DIAGNOSIS — L570 Actinic keratosis: Secondary | ICD-10-CM | POA: Diagnosis not present

## 2021-01-16 DIAGNOSIS — D225 Melanocytic nevi of trunk: Secondary | ICD-10-CM | POA: Diagnosis not present

## 2021-01-16 DIAGNOSIS — D2262 Melanocytic nevi of left upper limb, including shoulder: Secondary | ICD-10-CM | POA: Diagnosis not present

## 2021-02-09 ENCOUNTER — Ambulatory Visit
Admission: RE | Admit: 2021-02-09 | Discharge: 2021-02-09 | Disposition: A | Discharge status: Home / Self Care | Payer: PPO | Source: Ambulatory Visit | Attending: Internal Medicine | Admitting: Internal Medicine

## 2021-02-09 ENCOUNTER — Other Ambulatory Visit: Payer: Self-pay

## 2021-02-09 DIAGNOSIS — Z1231 Encounter for screening mammogram for malignant neoplasm of breast: Secondary | ICD-10-CM | POA: Diagnosis not present

## 2021-02-21 DIAGNOSIS — M810 Age-related osteoporosis without current pathological fracture: Secondary | ICD-10-CM | POA: Diagnosis not present

## 2021-02-21 DIAGNOSIS — R3129 Other microscopic hematuria: Secondary | ICD-10-CM | POA: Diagnosis not present

## 2021-02-21 DIAGNOSIS — D582 Other hemoglobinopathies: Secondary | ICD-10-CM | POA: Diagnosis not present

## 2021-02-21 DIAGNOSIS — E7800 Pure hypercholesterolemia, unspecified: Secondary | ICD-10-CM | POA: Diagnosis not present

## 2021-02-21 DIAGNOSIS — I10 Essential (primary) hypertension: Secondary | ICD-10-CM | POA: Diagnosis not present

## 2021-02-28 DIAGNOSIS — M1990 Unspecified osteoarthritis, unspecified site: Secondary | ICD-10-CM | POA: Diagnosis not present

## 2021-02-28 DIAGNOSIS — E559 Vitamin D deficiency, unspecified: Secondary | ICD-10-CM | POA: Diagnosis not present

## 2021-02-28 DIAGNOSIS — Z23 Encounter for immunization: Secondary | ICD-10-CM | POA: Diagnosis not present

## 2021-02-28 DIAGNOSIS — D582 Other hemoglobinopathies: Secondary | ICD-10-CM | POA: Diagnosis not present

## 2021-02-28 DIAGNOSIS — M810 Age-related osteoporosis without current pathological fracture: Secondary | ICD-10-CM | POA: Diagnosis not present

## 2021-02-28 DIAGNOSIS — I10 Essential (primary) hypertension: Secondary | ICD-10-CM | POA: Diagnosis not present

## 2021-02-28 DIAGNOSIS — R3129 Other microscopic hematuria: Secondary | ICD-10-CM | POA: Diagnosis not present

## 2021-02-28 DIAGNOSIS — E7800 Pure hypercholesterolemia, unspecified: Secondary | ICD-10-CM | POA: Diagnosis not present

## 2021-04-19 DIAGNOSIS — H25813 Combined forms of age-related cataract, bilateral: Secondary | ICD-10-CM | POA: Diagnosis not present

## 2021-06-13 DIAGNOSIS — M810 Age-related osteoporosis without current pathological fracture: Secondary | ICD-10-CM | POA: Diagnosis not present

## 2021-07-05 DIAGNOSIS — M810 Age-related osteoporosis without current pathological fracture: Secondary | ICD-10-CM | POA: Diagnosis not present

## 2021-08-29 DIAGNOSIS — E559 Vitamin D deficiency, unspecified: Secondary | ICD-10-CM | POA: Diagnosis not present

## 2021-08-29 DIAGNOSIS — I10 Essential (primary) hypertension: Secondary | ICD-10-CM | POA: Diagnosis not present

## 2021-08-29 DIAGNOSIS — M810 Age-related osteoporosis without current pathological fracture: Secondary | ICD-10-CM | POA: Diagnosis not present

## 2021-08-29 DIAGNOSIS — D582 Other hemoglobinopathies: Secondary | ICD-10-CM | POA: Diagnosis not present

## 2021-08-29 DIAGNOSIS — E7800 Pure hypercholesterolemia, unspecified: Secondary | ICD-10-CM | POA: Diagnosis not present

## 2021-08-29 DIAGNOSIS — R3129 Other microscopic hematuria: Secondary | ICD-10-CM | POA: Diagnosis not present

## 2021-09-05 DIAGNOSIS — D582 Other hemoglobinopathies: Secondary | ICD-10-CM | POA: Diagnosis not present

## 2021-09-05 DIAGNOSIS — R3129 Other microscopic hematuria: Secondary | ICD-10-CM | POA: Diagnosis not present

## 2021-09-05 DIAGNOSIS — M810 Age-related osteoporosis without current pathological fracture: Secondary | ICD-10-CM | POA: Diagnosis not present

## 2021-09-05 DIAGNOSIS — E7800 Pure hypercholesterolemia, unspecified: Secondary | ICD-10-CM | POA: Diagnosis not present

## 2021-09-05 DIAGNOSIS — R739 Hyperglycemia, unspecified: Secondary | ICD-10-CM | POA: Diagnosis not present

## 2021-09-05 DIAGNOSIS — I10 Essential (primary) hypertension: Secondary | ICD-10-CM | POA: Diagnosis not present

## 2021-09-05 DIAGNOSIS — Z0000 Encounter for general adult medical examination without abnormal findings: Secondary | ICD-10-CM | POA: Diagnosis not present

## 2021-11-25 DIAGNOSIS — Z03818 Encounter for observation for suspected exposure to other biological agents ruled out: Secondary | ICD-10-CM | POA: Diagnosis not present

## 2021-11-25 DIAGNOSIS — U071 COVID-19: Secondary | ICD-10-CM | POA: Diagnosis not present

## 2022-01-04 ENCOUNTER — Other Ambulatory Visit: Payer: Self-pay | Admitting: Internal Medicine

## 2022-01-04 DIAGNOSIS — Z1231 Encounter for screening mammogram for malignant neoplasm of breast: Secondary | ICD-10-CM

## 2022-01-09 DIAGNOSIS — M810 Age-related osteoporosis without current pathological fracture: Secondary | ICD-10-CM | POA: Diagnosis not present

## 2022-02-11 ENCOUNTER — Ambulatory Visit
Admission: RE | Admit: 2022-02-11 | Discharge: 2022-02-11 | Disposition: A | Discharge status: Home / Self Care | Payer: PPO | Source: Ambulatory Visit | Attending: Internal Medicine | Admitting: Internal Medicine

## 2022-02-11 DIAGNOSIS — Z1231 Encounter for screening mammogram for malignant neoplasm of breast: Secondary | ICD-10-CM | POA: Insufficient documentation

## 2022-02-12 DIAGNOSIS — D2262 Melanocytic nevi of left upper limb, including shoulder: Secondary | ICD-10-CM | POA: Diagnosis not present

## 2022-02-12 DIAGNOSIS — D2272 Melanocytic nevi of left lower limb, including hip: Secondary | ICD-10-CM | POA: Diagnosis not present

## 2022-02-12 DIAGNOSIS — D2261 Melanocytic nevi of right upper limb, including shoulder: Secondary | ICD-10-CM | POA: Diagnosis not present

## 2022-02-12 DIAGNOSIS — D225 Melanocytic nevi of trunk: Secondary | ICD-10-CM | POA: Diagnosis not present

## 2022-02-12 DIAGNOSIS — Z85828 Personal history of other malignant neoplasm of skin: Secondary | ICD-10-CM | POA: Diagnosis not present

## 2022-03-01 DIAGNOSIS — R739 Hyperglycemia, unspecified: Secondary | ICD-10-CM | POA: Diagnosis not present

## 2022-03-01 DIAGNOSIS — E7800 Pure hypercholesterolemia, unspecified: Secondary | ICD-10-CM | POA: Diagnosis not present

## 2022-03-01 DIAGNOSIS — D582 Other hemoglobinopathies: Secondary | ICD-10-CM | POA: Diagnosis not present

## 2022-03-08 DIAGNOSIS — M810 Age-related osteoporosis without current pathological fracture: Secondary | ICD-10-CM | POA: Diagnosis not present

## 2022-03-08 DIAGNOSIS — I10 Essential (primary) hypertension: Secondary | ICD-10-CM | POA: Diagnosis not present

## 2022-03-08 DIAGNOSIS — D582 Other hemoglobinopathies: Secondary | ICD-10-CM | POA: Diagnosis not present

## 2022-03-08 DIAGNOSIS — R739 Hyperglycemia, unspecified: Secondary | ICD-10-CM | POA: Diagnosis not present

## 2022-03-08 DIAGNOSIS — R3129 Other microscopic hematuria: Secondary | ICD-10-CM | POA: Diagnosis not present

## 2022-03-08 DIAGNOSIS — E7800 Pure hypercholesterolemia, unspecified: Secondary | ICD-10-CM | POA: Diagnosis not present

## 2022-04-26 DIAGNOSIS — H43813 Vitreous degeneration, bilateral: Secondary | ICD-10-CM | POA: Diagnosis not present

## 2022-04-26 DIAGNOSIS — H2513 Age-related nuclear cataract, bilateral: Secondary | ICD-10-CM | POA: Diagnosis not present

## 2022-05-09 DIAGNOSIS — Z86010 Personal history of colonic polyps: Secondary | ICD-10-CM | POA: Diagnosis not present

## 2022-06-06 DIAGNOSIS — L509 Urticaria, unspecified: Secondary | ICD-10-CM | POA: Diagnosis not present

## 2022-06-06 DIAGNOSIS — R030 Elevated blood-pressure reading, without diagnosis of hypertension: Secondary | ICD-10-CM | POA: Diagnosis not present

## 2022-06-11 DIAGNOSIS — Z86010 Personal history of colonic polyps: Secondary | ICD-10-CM | POA: Diagnosis not present

## 2022-06-11 DIAGNOSIS — K640 First degree hemorrhoids: Secondary | ICD-10-CM | POA: Diagnosis not present

## 2022-06-20 DIAGNOSIS — M810 Age-related osteoporosis without current pathological fracture: Secondary | ICD-10-CM | POA: Diagnosis not present

## 2022-07-19 DIAGNOSIS — M810 Age-related osteoporosis without current pathological fracture: Secondary | ICD-10-CM | POA: Diagnosis not present

## 2022-09-03 DIAGNOSIS — R739 Hyperglycemia, unspecified: Secondary | ICD-10-CM | POA: Diagnosis not present

## 2022-09-03 DIAGNOSIS — E7800 Pure hypercholesterolemia, unspecified: Secondary | ICD-10-CM | POA: Diagnosis not present

## 2022-09-03 DIAGNOSIS — D582 Other hemoglobinopathies: Secondary | ICD-10-CM | POA: Diagnosis not present

## 2022-09-03 DIAGNOSIS — R3129 Other microscopic hematuria: Secondary | ICD-10-CM | POA: Diagnosis not present

## 2022-09-03 DIAGNOSIS — I10 Essential (primary) hypertension: Secondary | ICD-10-CM | POA: Diagnosis not present

## 2022-09-10 DIAGNOSIS — Z0000 Encounter for general adult medical examination without abnormal findings: Secondary | ICD-10-CM | POA: Diagnosis not present

## 2022-09-10 DIAGNOSIS — M810 Age-related osteoporosis without current pathological fracture: Secondary | ICD-10-CM | POA: Diagnosis not present

## 2022-09-10 DIAGNOSIS — I10 Essential (primary) hypertension: Secondary | ICD-10-CM | POA: Diagnosis not present

## 2022-09-10 DIAGNOSIS — R3129 Other microscopic hematuria: Secondary | ICD-10-CM | POA: Diagnosis not present

## 2022-09-10 DIAGNOSIS — D582 Other hemoglobinopathies: Secondary | ICD-10-CM | POA: Diagnosis not present

## 2022-09-10 DIAGNOSIS — R7303 Prediabetes: Secondary | ICD-10-CM | POA: Diagnosis not present

## 2022-09-10 DIAGNOSIS — E7800 Pure hypercholesterolemia, unspecified: Secondary | ICD-10-CM | POA: Diagnosis not present

## 2022-12-28 DIAGNOSIS — L0291 Cutaneous abscess, unspecified: Secondary | ICD-10-CM | POA: Diagnosis not present

## 2022-12-28 DIAGNOSIS — L03012 Cellulitis of left finger: Secondary | ICD-10-CM | POA: Diagnosis not present

## 2023-01-02 DIAGNOSIS — L03012 Cellulitis of left finger: Secondary | ICD-10-CM | POA: Diagnosis not present

## 2023-01-02 DIAGNOSIS — I10 Essential (primary) hypertension: Secondary | ICD-10-CM | POA: Diagnosis not present

## 2023-01-07 ENCOUNTER — Other Ambulatory Visit: Payer: Self-pay | Admitting: Internal Medicine

## 2023-01-07 DIAGNOSIS — Z1231 Encounter for screening mammogram for malignant neoplasm of breast: Secondary | ICD-10-CM

## 2023-01-22 DIAGNOSIS — M810 Age-related osteoporosis without current pathological fracture: Secondary | ICD-10-CM | POA: Diagnosis not present

## 2023-02-12 ENCOUNTER — Ambulatory Visit
Admission: RE | Admit: 2023-02-12 | Discharge: 2023-02-12 | Disposition: A | Discharge status: Home / Self Care | Payer: PPO | Source: Ambulatory Visit | Attending: Internal Medicine | Admitting: Internal Medicine

## 2023-02-12 DIAGNOSIS — Z1231 Encounter for screening mammogram for malignant neoplasm of breast: Secondary | ICD-10-CM | POA: Insufficient documentation

## 2023-02-19 ENCOUNTER — Ambulatory Visit
Admission: RE | Admit: 2023-02-19 | Discharge: 2023-02-19 | Disposition: A | Discharge status: Home / Self Care | Payer: PPO | Source: Ambulatory Visit | Attending: Internal Medicine | Admitting: Internal Medicine

## 2023-02-19 DIAGNOSIS — Z1231 Encounter for screening mammogram for malignant neoplasm of breast: Secondary | ICD-10-CM | POA: Insufficient documentation

## 2023-02-24 DIAGNOSIS — D2271 Melanocytic nevi of right lower limb, including hip: Secondary | ICD-10-CM | POA: Diagnosis not present

## 2023-02-24 DIAGNOSIS — D2261 Melanocytic nevi of right upper limb, including shoulder: Secondary | ICD-10-CM | POA: Diagnosis not present

## 2023-02-24 DIAGNOSIS — D2272 Melanocytic nevi of left lower limb, including hip: Secondary | ICD-10-CM | POA: Diagnosis not present

## 2023-02-24 DIAGNOSIS — Z08 Encounter for follow-up examination after completed treatment for malignant neoplasm: Secondary | ICD-10-CM | POA: Diagnosis not present

## 2023-02-24 DIAGNOSIS — D2262 Melanocytic nevi of left upper limb, including shoulder: Secondary | ICD-10-CM | POA: Diagnosis not present

## 2023-02-24 DIAGNOSIS — Z85828 Personal history of other malignant neoplasm of skin: Secondary | ICD-10-CM | POA: Diagnosis not present

## 2023-03-11 DIAGNOSIS — I10 Essential (primary) hypertension: Secondary | ICD-10-CM | POA: Diagnosis not present

## 2023-03-11 DIAGNOSIS — D582 Other hemoglobinopathies: Secondary | ICD-10-CM | POA: Diagnosis not present

## 2023-03-11 DIAGNOSIS — E7800 Pure hypercholesterolemia, unspecified: Secondary | ICD-10-CM | POA: Diagnosis not present

## 2023-03-11 DIAGNOSIS — R7303 Prediabetes: Secondary | ICD-10-CM | POA: Diagnosis not present

## 2023-03-18 DIAGNOSIS — E7800 Pure hypercholesterolemia, unspecified: Secondary | ICD-10-CM | POA: Diagnosis not present

## 2023-03-18 DIAGNOSIS — M810 Age-related osteoporosis without current pathological fracture: Secondary | ICD-10-CM | POA: Diagnosis not present

## 2023-03-18 DIAGNOSIS — R7303 Prediabetes: Secondary | ICD-10-CM | POA: Diagnosis not present

## 2023-03-18 DIAGNOSIS — D582 Other hemoglobinopathies: Secondary | ICD-10-CM | POA: Diagnosis not present

## 2023-03-18 DIAGNOSIS — R3129 Other microscopic hematuria: Secondary | ICD-10-CM | POA: Diagnosis not present

## 2023-03-18 DIAGNOSIS — I10 Essential (primary) hypertension: Secondary | ICD-10-CM | POA: Diagnosis not present

## 2023-03-18 DIAGNOSIS — M1990 Unspecified osteoarthritis, unspecified site: Secondary | ICD-10-CM | POA: Diagnosis not present

## 2023-03-18 DIAGNOSIS — N1831 Chronic kidney disease, stage 3a: Secondary | ICD-10-CM | POA: Diagnosis not present

## 2023-03-20 DIAGNOSIS — M8588 Other specified disorders of bone density and structure, other site: Secondary | ICD-10-CM | POA: Diagnosis not present

## 2023-03-27 DIAGNOSIS — M810 Age-related osteoporosis without current pathological fracture: Secondary | ICD-10-CM | POA: Diagnosis not present

## 2023-04-29 DIAGNOSIS — H2513 Age-related nuclear cataract, bilateral: Secondary | ICD-10-CM | POA: Diagnosis not present

## 2023-04-29 DIAGNOSIS — H43813 Vitreous degeneration, bilateral: Secondary | ICD-10-CM | POA: Diagnosis not present

## 2023-04-29 DIAGNOSIS — H04123 Dry eye syndrome of bilateral lacrimal glands: Secondary | ICD-10-CM | POA: Diagnosis not present

## 2023-04-29 DIAGNOSIS — H1013 Acute atopic conjunctivitis, bilateral: Secondary | ICD-10-CM | POA: Diagnosis not present

## 2023-06-18 DIAGNOSIS — M810 Age-related osteoporosis without current pathological fracture: Secondary | ICD-10-CM | POA: Diagnosis not present

## 2023-07-15 DIAGNOSIS — M25562 Pain in left knee: Secondary | ICD-10-CM | POA: Diagnosis not present

## 2023-07-15 DIAGNOSIS — M25552 Pain in left hip: Secondary | ICD-10-CM | POA: Diagnosis not present

## 2023-07-15 DIAGNOSIS — M1612 Unilateral primary osteoarthritis, left hip: Secondary | ICD-10-CM | POA: Diagnosis not present

## 2023-09-10 DIAGNOSIS — N1831 Chronic kidney disease, stage 3a: Secondary | ICD-10-CM | POA: Diagnosis not present

## 2023-09-10 DIAGNOSIS — M810 Age-related osteoporosis without current pathological fracture: Secondary | ICD-10-CM | POA: Diagnosis not present

## 2023-09-10 DIAGNOSIS — E7800 Pure hypercholesterolemia, unspecified: Secondary | ICD-10-CM | POA: Diagnosis not present

## 2023-09-10 DIAGNOSIS — I10 Essential (primary) hypertension: Secondary | ICD-10-CM | POA: Diagnosis not present

## 2023-09-10 DIAGNOSIS — R7303 Prediabetes: Secondary | ICD-10-CM | POA: Diagnosis not present

## 2023-09-17 DIAGNOSIS — D582 Other hemoglobinopathies: Secondary | ICD-10-CM | POA: Diagnosis not present

## 2023-09-17 DIAGNOSIS — N1831 Chronic kidney disease, stage 3a: Secondary | ICD-10-CM | POA: Diagnosis not present

## 2023-09-17 DIAGNOSIS — I10 Essential (primary) hypertension: Secondary | ICD-10-CM | POA: Diagnosis not present

## 2023-09-17 DIAGNOSIS — R3129 Other microscopic hematuria: Secondary | ICD-10-CM | POA: Diagnosis not present

## 2023-09-17 DIAGNOSIS — R7303 Prediabetes: Secondary | ICD-10-CM | POA: Diagnosis not present

## 2023-09-17 DIAGNOSIS — Z0000 Encounter for general adult medical examination without abnormal findings: Secondary | ICD-10-CM | POA: Diagnosis not present

## 2023-09-17 DIAGNOSIS — M810 Age-related osteoporosis without current pathological fracture: Secondary | ICD-10-CM | POA: Diagnosis not present

## 2023-09-17 DIAGNOSIS — E7800 Pure hypercholesterolemia, unspecified: Secondary | ICD-10-CM | POA: Diagnosis not present

## 2023-12-05 DIAGNOSIS — M84374A Stress fracture, right foot, initial encounter for fracture: Secondary | ICD-10-CM | POA: Diagnosis not present

## 2023-12-05 DIAGNOSIS — M79671 Pain in right foot: Secondary | ICD-10-CM | POA: Diagnosis not present

## 2024-01-08 ENCOUNTER — Other Ambulatory Visit: Payer: Self-pay | Admitting: Internal Medicine

## 2024-01-08 DIAGNOSIS — Z1231 Encounter for screening mammogram for malignant neoplasm of breast: Secondary | ICD-10-CM

## 2024-02-17 ENCOUNTER — Encounter: Payer: Self-pay | Admitting: Emergency Medicine

## 2024-02-17 ENCOUNTER — Other Ambulatory Visit: Payer: Self-pay

## 2024-02-17 ENCOUNTER — Emergency Department

## 2024-02-17 ENCOUNTER — Emergency Department
Admission: EM | Admit: 2024-02-17 | Discharge: 2024-02-18 | Disposition: A | Discharge status: Home / Self Care | Attending: Emergency Medicine | Admitting: Emergency Medicine

## 2024-02-17 DIAGNOSIS — I10 Essential (primary) hypertension: Secondary | ICD-10-CM | POA: Diagnosis not present

## 2024-02-17 DIAGNOSIS — S43014A Anterior dislocation of right humerus, initial encounter: Secondary | ICD-10-CM | POA: Diagnosis not present

## 2024-02-17 DIAGNOSIS — S43004A Unspecified dislocation of right shoulder joint, initial encounter: Secondary | ICD-10-CM | POA: Diagnosis not present

## 2024-02-17 DIAGNOSIS — M19011 Primary osteoarthritis, right shoulder: Secondary | ICD-10-CM | POA: Diagnosis not present

## 2024-02-17 DIAGNOSIS — W109XXA Fall (on) (from) unspecified stairs and steps, initial encounter: Secondary | ICD-10-CM | POA: Insufficient documentation

## 2024-02-17 DIAGNOSIS — M25511 Pain in right shoulder: Secondary | ICD-10-CM | POA: Diagnosis present

## 2024-02-17 MED ORDER — ONDANSETRON HCL 4 MG/2ML IJ SOLN
4.0000 mg | Freq: Once | INTRAMUSCULAR | Status: AC
  Administered 2024-02-17 (×2): 4 mg via INTRAVENOUS
  Filled 2024-02-17: qty 2

## 2024-02-17 MED ORDER — FENTANYL CITRATE PF 50 MCG/ML IJ SOSY
100.0000 ug | PREFILLED_SYRINGE | Freq: Once | INTRAMUSCULAR | Status: AC
  Administered 2024-02-17 (×2): 100 ug via INTRAVENOUS
  Filled 2024-02-17: qty 2

## 2024-02-17 NOTE — Discharge Instructions (Addendum)
 Please wear your sling for the next 2 weeks.  He may take this off to shower but do not lift your right arm over 90 degrees.  Please call the number provided for orthopedics to arrange a follow-up appointment.  Please take Tylenol as needed for discomfort 1000 mg every 6 hours.  Return to the emergency department for any worsening pain or significant concern of ears.

## 2024-02-17 NOTE — ED Triage Notes (Signed)
 Pt reports while walking at home her sandals caught the floor and she fell forward onto her face and right shoulder. Pt has deformity to the right shoulder. No blood thinner use.

## 2024-02-17 NOTE — ED Provider Notes (Signed)
 [facility] Provider Note    Event Date/Time   First MD Initiated Contact with Patient 02/17/24 1925     (approximate)  History   Chief Complaint: Fall  HPI  Tracey Bradshaw is a 73 y.o. female with a past medical history of arthritis, hyperlipidemia, hypertension, presents to the emergency department after a fall.  According to the patient she lost her balance while going down steps and fell onto the right side.  She is not sure if she hit her shoulder she tried to catch herself on the couch.  Patient is experiencing pain to the right shoulder significant pain with any attempted range of motion.  Did not hit her head.  No LOC.  No other injuries or complaints.  Physical Exam   Triage Vital Signs: ED Triage Vitals  Encounter Vitals Group     BP 02/17/24 1828 (!) 166/88     Girls Systolic BP Percentile --      Girls Diastolic BP Percentile --      Boys Systolic BP Percentile --      Boys Diastolic BP Percentile --      Pulse Rate 02/17/24 1828 85     Resp 02/17/24 1828 15     Temp 02/17/24 1828 97.9 F (36.6 C)     Temp Source 02/17/24 1828 Oral     SpO2 02/17/24 1828 100 %     Weight --      Height --      Head Circumference --      Peak Flow --      Pain Score 02/17/24 1829 8     Pain Loc --      Pain Education --      Exclude from Growth Chart --     Most recent vital signs: Vitals:   02/17/24 1828  BP: (!) 166/88  Pulse: 85  Resp: 15  Temp: 97.9 F (36.6 C)  SpO2: 100%    General: Awake, no distress.  CV:  Good peripheral perfusion.  Regular rate and rhythm  Resp:  Normal effort.  Equal breath sounds bilaterally.  Abd:  No distention.    ED Results / Procedures / Treatments    RADIOLOGY  I have reviewed interpreted the shoulder x-ray images.  Patient appears to have a dislocation but no obvious fracture seen on my evaluation. Radiology confirms anterior-inferior dislocation possible small avulsion fracture.  X-ray shows  interval relocation of right humeral head possible Bankart fracture versus osteophytic spurring.  MEDICATIONS ORDERED IN ED: Medications - No data to display   IMPRESSION / MDM / ASSESSMENT AND PLAN / ED COURSE  I reviewed the triage vital signs and the nursing notes.  Patient's presentation is most consistent with acute presentation with potential threat to life or bodily function.  Patient presents the emergency department after a fall.  Patient appears to have a right shoulder dislocation on x-ray imaging.  We will dose pain medication and attempt reduction without sedation.  If unsuccessful patient may require conscious sedation for reduction.  Awaiting radiology read but I do not appreciate any obvious fracture.  Patient given 100 mcg of fentanyl  placed in a prone position traction and scapular manipulation with successful reduction.  Will repeat an x-ray to ensure reduction.  X-ray confirms relocation.  Will discharge patient in a sling for the right upper extremity and orthopedic follow-up.  Patient agreeable to plan.  Reduction of dislocation Date/Time: 7:57 PM Performed by: Franky Moores Authorized by:  Franky Moores Consent: Verbal consent obtained. Risks and benefits: risks, benefits and alternatives were discussed Consent given by: patient Required items: required blood products, implants, devices, and special equipment available Time out: Immediately prior to procedure a time out was called to verify the correct patient, procedure, equipment, support staff and site/side marked as required.  Patient sedated: No, Fentanyl  only   Vitals: Vital signs were monitored during sedation. Patient tolerance: Patient tolerated the procedure well with no immediate complications. Joint: Right shoulder Reduction technique: Prone positioning retraction and scapular manipulation     FINAL CLINICAL IMPRESSION(S) / ED DIAGNOSES   right shoulder dislocation   Note:  This  document was prepared using Dragon voice recognition software and may include unintentional dictation errors.   Moores Franky, MD 02/17/24 2139

## 2024-02-17 NOTE — ED Notes (Signed)
 Patient placed into sling post reduction and xray at bedside to obtain film. Patient on CCM, VSS, call light within reach. Family at bedside.

## 2024-02-20 ENCOUNTER — Encounter

## 2024-03-02 DIAGNOSIS — S42141A Displaced fracture of glenoid cavity of scapula, right shoulder, initial encounter for closed fracture: Secondary | ICD-10-CM | POA: Diagnosis not present

## 2024-03-02 DIAGNOSIS — S42151A Displaced fracture of neck of scapula, right shoulder, initial encounter for closed fracture: Secondary | ICD-10-CM | POA: Diagnosis not present

## 2024-03-02 DIAGNOSIS — S43004A Unspecified dislocation of right shoulder joint, initial encounter: Secondary | ICD-10-CM | POA: Diagnosis not present

## 2024-03-03 ENCOUNTER — Other Ambulatory Visit: Payer: Self-pay | Admitting: Student

## 2024-03-03 DIAGNOSIS — S43004A Unspecified dislocation of right shoulder joint, initial encounter: Secondary | ICD-10-CM

## 2024-03-03 DIAGNOSIS — S42141A Displaced fracture of glenoid cavity of scapula, right shoulder, initial encounter for closed fracture: Secondary | ICD-10-CM

## 2024-03-09 ENCOUNTER — Ambulatory Visit
Admission: RE | Admit: 2024-03-09 | Discharge: 2024-03-09 | Disposition: A | Discharge status: Home / Self Care | Source: Ambulatory Visit | Attending: Student | Admitting: Student

## 2024-03-09 DIAGNOSIS — S42151A Displaced fracture of neck of scapula, right shoulder, initial encounter for closed fracture: Secondary | ICD-10-CM | POA: Insufficient documentation

## 2024-03-09 DIAGNOSIS — S43004A Unspecified dislocation of right shoulder joint, initial encounter: Secondary | ICD-10-CM | POA: Diagnosis not present

## 2024-03-09 DIAGNOSIS — S42141A Displaced fracture of glenoid cavity of scapula, right shoulder, initial encounter for closed fracture: Secondary | ICD-10-CM | POA: Insufficient documentation

## 2024-03-09 DIAGNOSIS — M19011 Primary osteoarthritis, right shoulder: Secondary | ICD-10-CM | POA: Diagnosis not present

## 2024-03-12 DIAGNOSIS — D582 Other hemoglobinopathies: Secondary | ICD-10-CM | POA: Diagnosis not present

## 2024-03-12 DIAGNOSIS — E7800 Pure hypercholesterolemia, unspecified: Secondary | ICD-10-CM | POA: Diagnosis not present

## 2024-03-12 DIAGNOSIS — I10 Essential (primary) hypertension: Secondary | ICD-10-CM | POA: Diagnosis not present

## 2024-03-12 DIAGNOSIS — R7303 Prediabetes: Secondary | ICD-10-CM | POA: Diagnosis not present

## 2024-03-12 DIAGNOSIS — N1831 Chronic kidney disease, stage 3a: Secondary | ICD-10-CM | POA: Diagnosis not present

## 2024-03-19 DIAGNOSIS — R7303 Prediabetes: Secondary | ICD-10-CM | POA: Diagnosis not present

## 2024-03-19 DIAGNOSIS — M810 Age-related osteoporosis without current pathological fracture: Secondary | ICD-10-CM | POA: Diagnosis not present

## 2024-03-19 DIAGNOSIS — E7800 Pure hypercholesterolemia, unspecified: Secondary | ICD-10-CM | POA: Diagnosis not present

## 2024-03-19 DIAGNOSIS — N1831 Chronic kidney disease, stage 3a: Secondary | ICD-10-CM | POA: Diagnosis not present

## 2024-03-19 DIAGNOSIS — D582 Other hemoglobinopathies: Secondary | ICD-10-CM | POA: Diagnosis not present

## 2024-03-19 DIAGNOSIS — I10 Essential (primary) hypertension: Secondary | ICD-10-CM | POA: Diagnosis not present

## 2024-03-19 DIAGNOSIS — R3129 Other microscopic hematuria: Secondary | ICD-10-CM | POA: Diagnosis not present

## 2024-03-26 ENCOUNTER — Ambulatory Visit
Admission: RE | Admit: 2024-03-26 | Discharge: 2024-03-26 | Disposition: A | Discharge status: Home / Self Care | Source: Ambulatory Visit | Attending: Internal Medicine | Admitting: Internal Medicine

## 2024-03-26 DIAGNOSIS — Z1231 Encounter for screening mammogram for malignant neoplasm of breast: Secondary | ICD-10-CM | POA: Diagnosis not present

## 2024-03-30 DIAGNOSIS — S43004A Unspecified dislocation of right shoulder joint, initial encounter: Secondary | ICD-10-CM | POA: Diagnosis not present

## 2024-04-01 DIAGNOSIS — M810 Age-related osteoporosis without current pathological fracture: Secondary | ICD-10-CM | POA: Diagnosis not present

## 2024-04-06 DIAGNOSIS — G8929 Other chronic pain: Secondary | ICD-10-CM | POA: Diagnosis not present

## 2024-04-06 DIAGNOSIS — M25511 Pain in right shoulder: Secondary | ICD-10-CM | POA: Diagnosis not present

## 2024-04-13 DIAGNOSIS — G8929 Other chronic pain: Secondary | ICD-10-CM | POA: Diagnosis not present

## 2024-04-13 DIAGNOSIS — M25511 Pain in right shoulder: Secondary | ICD-10-CM | POA: Diagnosis not present

## 2024-04-23 DIAGNOSIS — M25511 Pain in right shoulder: Secondary | ICD-10-CM | POA: Diagnosis not present

## 2024-04-23 DIAGNOSIS — M6281 Muscle weakness (generalized): Secondary | ICD-10-CM | POA: Diagnosis not present

## 2024-04-23 DIAGNOSIS — S43004D Unspecified dislocation of right shoulder joint, subsequent encounter: Secondary | ICD-10-CM | POA: Diagnosis not present

## 2024-04-23 DIAGNOSIS — M25611 Stiffness of right shoulder, not elsewhere classified: Secondary | ICD-10-CM | POA: Diagnosis not present

## 2024-04-23 DIAGNOSIS — G8929 Other chronic pain: Secondary | ICD-10-CM | POA: Diagnosis not present

## 2024-04-27 DIAGNOSIS — M25511 Pain in right shoulder: Secondary | ICD-10-CM | POA: Diagnosis not present

## 2024-04-27 DIAGNOSIS — G8929 Other chronic pain: Secondary | ICD-10-CM | POA: Diagnosis not present

## 2024-04-27 DIAGNOSIS — S43004D Unspecified dislocation of right shoulder joint, subsequent encounter: Secondary | ICD-10-CM | POA: Diagnosis not present

## 2024-04-27 DIAGNOSIS — M25611 Stiffness of right shoulder, not elsewhere classified: Secondary | ICD-10-CM | POA: Diagnosis not present

## 2024-04-27 DIAGNOSIS — M6281 Muscle weakness (generalized): Secondary | ICD-10-CM | POA: Diagnosis not present

## 2024-05-03 DIAGNOSIS — H43813 Vitreous degeneration, bilateral: Secondary | ICD-10-CM | POA: Diagnosis not present

## 2024-05-03 DIAGNOSIS — H2513 Age-related nuclear cataract, bilateral: Secondary | ICD-10-CM | POA: Diagnosis not present

## 2024-05-03 DIAGNOSIS — H04123 Dry eye syndrome of bilateral lacrimal glands: Secondary | ICD-10-CM | POA: Diagnosis not present

## 2024-05-04 DIAGNOSIS — G8929 Other chronic pain: Secondary | ICD-10-CM | POA: Diagnosis not present

## 2024-05-04 DIAGNOSIS — M25511 Pain in right shoulder: Secondary | ICD-10-CM | POA: Diagnosis not present

## 2024-05-11 DIAGNOSIS — M6281 Muscle weakness (generalized): Secondary | ICD-10-CM | POA: Diagnosis not present

## 2024-05-11 DIAGNOSIS — M25511 Pain in right shoulder: Secondary | ICD-10-CM | POA: Diagnosis not present

## 2024-05-11 DIAGNOSIS — M25611 Stiffness of right shoulder, not elsewhere classified: Secondary | ICD-10-CM | POA: Diagnosis not present

## 2024-05-11 DIAGNOSIS — S43004D Unspecified dislocation of right shoulder joint, subsequent encounter: Secondary | ICD-10-CM | POA: Diagnosis not present

## 2024-05-11 DIAGNOSIS — G8929 Other chronic pain: Secondary | ICD-10-CM | POA: Diagnosis not present

## 2024-05-18 DIAGNOSIS — M25511 Pain in right shoulder: Secondary | ICD-10-CM | POA: Diagnosis not present

## 2024-05-18 DIAGNOSIS — G8929 Other chronic pain: Secondary | ICD-10-CM | POA: Diagnosis not present

## 2024-05-18 DIAGNOSIS — S43004D Unspecified dislocation of right shoulder joint, subsequent encounter: Secondary | ICD-10-CM | POA: Diagnosis not present

## 2024-05-18 DIAGNOSIS — M25611 Stiffness of right shoulder, not elsewhere classified: Secondary | ICD-10-CM | POA: Diagnosis not present

## 2024-05-18 DIAGNOSIS — M6281 Muscle weakness (generalized): Secondary | ICD-10-CM | POA: Diagnosis not present

## 2024-05-25 DIAGNOSIS — S43004D Unspecified dislocation of right shoulder joint, subsequent encounter: Secondary | ICD-10-CM | POA: Diagnosis not present

## 2024-05-25 DIAGNOSIS — M25511 Pain in right shoulder: Secondary | ICD-10-CM | POA: Diagnosis not present

## 2024-05-25 DIAGNOSIS — M6281 Muscle weakness (generalized): Secondary | ICD-10-CM | POA: Diagnosis not present

## 2024-05-25 DIAGNOSIS — G8929 Other chronic pain: Secondary | ICD-10-CM | POA: Diagnosis not present

## 2024-05-25 DIAGNOSIS — M25611 Stiffness of right shoulder, not elsewhere classified: Secondary | ICD-10-CM | POA: Diagnosis not present

## 2024-05-31 DIAGNOSIS — R7303 Prediabetes: Secondary | ICD-10-CM | POA: Diagnosis not present

## 2024-05-31 DIAGNOSIS — R3129 Other microscopic hematuria: Secondary | ICD-10-CM | POA: Diagnosis not present

## 2024-05-31 DIAGNOSIS — N1831 Chronic kidney disease, stage 3a: Secondary | ICD-10-CM | POA: Diagnosis not present

## 2024-05-31 DIAGNOSIS — E7800 Pure hypercholesterolemia, unspecified: Secondary | ICD-10-CM | POA: Diagnosis not present

## 2024-05-31 DIAGNOSIS — M810 Age-related osteoporosis without current pathological fracture: Secondary | ICD-10-CM | POA: Diagnosis not present

## 2024-05-31 DIAGNOSIS — D582 Other hemoglobinopathies: Secondary | ICD-10-CM | POA: Diagnosis not present

## 2024-05-31 DIAGNOSIS — Z2821 Immunization not carried out because of patient refusal: Secondary | ICD-10-CM | POA: Diagnosis not present

## 2024-05-31 DIAGNOSIS — I10 Essential (primary) hypertension: Secondary | ICD-10-CM | POA: Diagnosis not present

## 2024-06-01 DIAGNOSIS — M25611 Stiffness of right shoulder, not elsewhere classified: Secondary | ICD-10-CM | POA: Diagnosis not present

## 2024-06-01 DIAGNOSIS — S43004D Unspecified dislocation of right shoulder joint, subsequent encounter: Secondary | ICD-10-CM | POA: Diagnosis not present

## 2024-06-01 DIAGNOSIS — G8929 Other chronic pain: Secondary | ICD-10-CM | POA: Diagnosis not present

## 2024-06-01 DIAGNOSIS — M25511 Pain in right shoulder: Secondary | ICD-10-CM | POA: Diagnosis not present

## 2024-06-01 DIAGNOSIS — M6281 Muscle weakness (generalized): Secondary | ICD-10-CM | POA: Diagnosis not present
